# Patient Record
Sex: Female | Born: 1990 | Race: White | Hispanic: No | Marital: Married
Health system: Southern US, Community
[De-identification: ages and names within clinical notes are randomized; demographics above are authoritative.]

## PROBLEM LIST (undated history)

## (undated) DIAGNOSIS — E039 Hypothyroidism, unspecified: Secondary | ICD-10-CM

## (undated) DIAGNOSIS — E079 Disorder of thyroid, unspecified: Secondary | ICD-10-CM

## (undated) DIAGNOSIS — K219 Gastro-esophageal reflux disease without esophagitis: Secondary | ICD-10-CM

## (undated) HISTORY — DX: Disorder of thyroid, unspecified: E07.9

---
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
## 2011-09-11 ENCOUNTER — Ambulatory Visit (INDEPENDENT_AMBULATORY_CARE_PROVIDER_SITE_OTHER): Payer: BC Managed Care – PPO | Admitting: Physician Assistant

## 2011-09-11 VITALS — BP 113/74 | HR 71 | Temp 98.8°F | Resp 16 | Ht 69.0 in | Wt 170.0 lb

## 2011-09-11 DIAGNOSIS — J029 Acute pharyngitis, unspecified: Secondary | ICD-10-CM

## 2011-09-11 DIAGNOSIS — J310 Chronic rhinitis: Secondary | ICD-10-CM

## 2011-09-11 MED ORDER — IPRATROPIUM BROMIDE 0.03 % NA SOLN: 2.0000 | Freq: Two times a day (BID) | NASAL | Status: DC

## 2011-09-11 MED ORDER — GUAIFENESIN ER 1200 MG PO TB12: 1.0000 | ORAL_TABLET | Freq: Two times a day (BID) | ORAL | Status: DC | PRN

## 2011-09-11 NOTE — Patient Instructions (Signed)
Get as much rest as you can (make sure the Jawbone UP bracelet shows it!) and drink at least 64 ounces of fluids each day.

## 2011-09-11 NOTE — Progress Notes (Signed)
  Subjective:    Patient ID: Alyssa Bryan, female    DOB: December 29, 1990, 21 y.o.   MRN: [id number]  HPI The patient presents with intermittent sore throat for the last 4 weeks. Initially she had "a cold" and it then resolved though she continues to have throat pain off-and-on. It is worse on the right than the left. Some ear pain on the right as well. No significant nasal congestion. No cough, fever or chills. No myalgias, arthralgias or rash. No GU or GI symptoms. No headache or dizziness.  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 She also works at [employer] She had the flu in December.   Review of Systems As above.    Objective:   Physical Exam  Vitals reviewed. Constitutional: She is oriented to person, place, and time. Vital signs are normal. She appears well-developed and well-nourished. No distress.  HENT:  Head: Normocephalic and atraumatic.  Right Ear: Hearing, tympanic membrane, external ear and ear canal normal.  Left Ear: Hearing, tympanic membrane, external ear and ear canal normal.  Nose: Mucosal edema and rhinorrhea present.  No foreign bodies. Right sinus exhibits no maxillary sinus tenderness and no frontal sinus tenderness. Left sinus exhibits no maxillary sinus tenderness and no frontal sinus tenderness.  Mouth/Throat: Uvula is midline, oropharynx is clear and moist and mucous membranes are normal. No uvula swelling. No oropharyngeal exudate.  Eyes: Conjunctivae and EOM are normal. Pupils are equal, round, and reactive to light. Right eye exhibits no discharge. Left eye exhibits no discharge. No scleral icterus.  Neck: Trachea normal, normal range of motion and full passive range of motion without pain. Neck supple. No mass and no thyromegaly present.  Cardiovascular: Normal rate, regular rhythm and normal heart sounds.   Pulmonary/Chest: Effort normal and breath sounds normal.  Lymphadenopathy:       Head (right side): No submandibular, no tonsillar, no preauricular, no  posterior auricular and no occipital adenopathy present.       Head (left side): No submandibular, no tonsillar, no preauricular and no occipital adenopathy present.    She has no cervical adenopathy.       Right: No supraclavicular adenopathy present.       Left: No supraclavicular adenopathy present.  Neurological: She is alert and oriented to person, place, and time. She has normal strength. No cranial nerve deficit or sensory deficit.  Skin: Skin is warm, dry and intact. No rash noted.  Psychiatric: She has a normal mood and affect. Her speech is normal and behavior is normal.          Assessment & Plan:  Sore throat secondary to rhinitis and postnasal drainage.  Atrovent nasal spray 0.03%, 2 sprays in each nostril twice daily. Mucinex maximum strength twice a day. Rest, fluids.

## 2012-01-23 ENCOUNTER — Ambulatory Visit: Payer: BC Managed Care – PPO

## 2012-01-23 ENCOUNTER — Ambulatory Visit (INDEPENDENT_AMBULATORY_CARE_PROVIDER_SITE_OTHER): Payer: BC Managed Care – PPO | Admitting: Family Medicine

## 2012-01-23 VITALS — BP 102/58 | HR 63 | Temp 98.1°F | Resp 16 | Ht 70.0 in | Wt 176.0 lb

## 2012-01-23 DIAGNOSIS — M542 Cervicalgia: Secondary | ICD-10-CM

## 2012-01-23 DIAGNOSIS — R202 Paresthesia of skin: Secondary | ICD-10-CM

## 2012-01-23 DIAGNOSIS — R209 Unspecified disturbances of skin sensation: Secondary | ICD-10-CM

## 2012-01-23 DIAGNOSIS — E039 Hypothyroidism, unspecified: Secondary | ICD-10-CM

## 2012-01-23 LAB — POCT CBC
MCH, POC: 30.5 pg (ref 27–31.2)
MCHC: 31.9 g/dL (ref 31.8–35.4)
MCV: 95.8 fL (ref 80–97)
MID (cbc): 0.8 (ref 0–0.9)
MPV: 9.2 fL (ref 0–99.8)
POC LYMPH PERCENT: 26.1 %L (ref 10–50)
POC MID %: 6.5 %M (ref 0–12)
Platelet Count, POC: 326 10*3/uL (ref 142–424)
RBC: 4.78 M/uL (ref 4.04–5.48)
RDW, POC: 13.3 %
WBC: 11.7 10*3/uL — AB (ref 4.6–10.2)

## 2012-01-23 LAB — COMPREHENSIVE METABOLIC PANEL
ALT: 16 U/L (ref 0–35)
AST: 15 U/L (ref 0–37)
Alkaline Phosphatase: 81 U/L (ref 39–117)
BUN: 11 mg/dL (ref 6–23)
Chloride: 104 mEq/L (ref 96–112)
Creat: 0.71 mg/dL (ref 0.50–1.10)
Total Bilirubin: 0.6 mg/dL (ref 0.3–1.2)

## 2012-01-23 LAB — FOLATE: Folate: >20 ng/mL

## 2012-01-23 LAB — POCT SEDIMENTATION RATE: POCT SED RATE: 6 mm/hr (ref 0–22)

## 2012-01-23 NOTE — Progress Notes (Addendum)
Date:  01/23/2012   Name:  Alyssa Bryan   DOB:  30-Sep-1990   MRN:  [id number]  PCP:  No primary provider on file.    Chief Complaint: Tingling   History of Present Illness:  Alyssa Bryan is a 21 y.o. very pleasant female patient who presents with the following:  About one year ago she was seen with tingling in her left arm.  This problem went away, but then returned earlier this spring and has continued to get worse.  She now notes tingling/ pins and needles in her left upper arm and forearm, the right forearm, and both lower legs.  Her symptoms have been worsening for the last 2 or 3 months. She has more recently noted symptoms in her fingers as well.   No difference with certain activities or positions.   She sometimes feels weakness- she has difficulty picking up heavy objects in her left hand.  She does not note excessive fatigue in general, but sometimes certain body parts feel tired.  No proximal muscle weakness- able to stand from a chair, do a sit- up, etc.    She has not noted any problems or changes with her vision or hearing.   She is generally healthy.  She had a right inguinal hernia repair at 21 years old- no other surgeries.  No other major medical history. No family history of autoimmune disease or MS that she knows of.  She works at [employer], and is Clinical biochemist at [employer].   Not a smoker, no drugs.  She rarely drinks alcohol.  Here today with her mother.   There is no problem list on file for this patient.   No past medical history on file.  No past surgical history on file.  History  Substance Use Topics  . Smoking status: Never Smoker   . Smokeless tobacco: Not on file  . Alcohol Use: Not on file    No family history on file.  No Known Allergies  Medication list has been reviewed and updated.  Current Outpatient Prescriptions on File Prior to Visit  Medication Sig Dispense Refill  . desogestrel-ethinyl estradiol  (KARIVA,AZURETTE,MIRCETTE) 0.15-0.02/0.01 MG (21/5) tablet Take 1 tablet by mouth daily.      . Guaifenesin (MUCINEX MAXIMUM STRENGTH) 1200 MG TB12 Take 1 tablet (1,200 mg total) by mouth every 12 (twelve) hours as needed.  14 tablet  1  . ipratropium (ATROVENT) 0.03 % nasal spray Place 2 sprays into the nose 2 (two) times daily.  30 mL  5    Review of Systems:  As per HPI- otherwise negative.   Physical Examination: Filed Vitals:   01/23/12 1348  BP: 102/58  Pulse: 63  Temp: 98.1 F (36.7 C)  Resp: 16   Filed Vitals:   01/23/12 1348  Height: 5\' 10"  (1.778 m)  Weight: 176 lb (79.833 kg)   Body mass index is 25.25 kg/(m^2). Ideal Body Weight: Weight in (lb) to have BMI = 25: 173.9   GEN: WDWN, NAD, Non-toxic, A & O x 3, robust and healthy in appearance HEENT: Atraumatic, Normocephalic. Neck supple. No masses, No LAD.  Tm and oropharynx wnl.    PEERL, EOMI.  Ears and Nose: No external deformity. CV: RRR, No M/G/R. No JVD. No thrill. No extra heart sounds. PULM: CTA B, no wheezes, crackles, rhonchi. No retractions. No resp. distress. No accessory muscle use. ABD: S, NT, ND, +BS. No rebound. No HSM. EXTR: No c/c/e NEURO Normal gait.   Normal  strength and sensation all extremities, normal DTR all extremities, neg romberg.  Neuro exam virtually normal at this time.  PSYCH: Normally interactive. Conversant. Not depressed or anxious appearing.  Calm demeanor.   [facility] reading (PRIMARY) by  Dr. Patsy Lager.  Negative cervical spine series  CERVICAL SPINE - COMPLETE 4+ VIEW  Comparison: None  Findings: Normal alignment is noted. There is no evidence of fracture, subluxation or prevertebral soft tissue swelling. The disc spaces are maintained. There is no evidence of bony foraminal narrowing. No focal bony lesions are present.  IMPRESSION: Unremarkable cervical spine series.   Results for orders placed in visit on 01/23/12  POCT CBC      Component Value Range   WBC 11.7 (*)  4.6 - 10.2 K/uL   Lymph, poc 3.1  0.6 - 3.4   POC LYMPH PERCENT 26.1  10 - 50 %L   MID (cbc) 0.8  0 - 0.9   POC MID % 6.5  0 - 12 %M   POC Granulocyte 7.9 (*) 2 - 6.9   Granulocyte percent 67.4  37 - 80 %G   RBC 4.78  4.04 - 5.48 M/uL   Hemoglobin 14.6  12.2 - 16.2 g/dL   HCT, POC 21.3  08.6 - 47.9 %   MCV 95.8  80 - 97 fL   MCH, POC 30.5  27 - 31.2 pg   MCHC 31.9  31.8 - 35.4 g/dL   RDW, POC 57.8     Platelet Count, POC 326  142 - 424 K/uL   MPV 9.2  0 - 99.8 fL  POCT SEDIMENTATION RATE      Component Value Range   POCT SED RATE 6  0 - 22 mm/hr   Assessment and Plan: 1. Tingling in extremities  POCT CBC, Comprehensive metabolic panel, TSH, Vitamin B12, Folate, DG Cervical Spine Complete, POCT SEDIMENTATION RATE   Await labs as above.  However, her symptoms are concerning for MS.  Percy has done her own research about her symptoms- she was not surprised when I mentioned MS.  Assuming no major lab abnormalities will plan to contact neurology- she may need an MRI brain to evaluate for MS.    Merlene Dante, MD  addnd 01/24/12  Results for orders placed in visit on 01/23/12  POCT CBC      Component Value Range   WBC 11.7 (*) 4.6 - 10.2 K/uL   Lymph, poc 3.1  0.6 - 3.4   POC LYMPH PERCENT 26.1  10 - 50 %L   MID (cbc) 0.8  0 - 0.9   POC MID % 6.5  0 - 12 %M   POC Granulocyte 7.9 (*) 2 - 6.9   Granulocyte percent 67.4  37 - 80 %G   RBC 4.78  4.04 - 5.48 M/uL   Hemoglobin 14.6  12.2 - 16.2 g/dL   HCT, POC 46.9  62.9 - 47.9 %   MCV 95.8  80 - 97 fL   MCH, POC 30.5  27 - 31.2 pg   MCHC 31.9  31.8 - 35.4 g/dL   RDW, POC 52.8     Platelet Count, POC 326  142 - 424 K/uL   MPV 9.2  0 - 99.8 fL  COMPREHENSIVE METABOLIC PANEL      Component Value Range   Sodium 139  135 - 145 mEq/L   Potassium 3.9  3.5 - 5.3 mEq/L   Chloride 104  96 - 112 mEq/L   CO2 26  19 - 32  mEq/L   Glucose, Bld 82  70 - 99 mg/dL   BUN 11  6 - 23 mg/dL   Creat 4.78  2.95 - 6.21 mg/dL   Total Bilirubin  0.6  0.3 - 1.2 mg/dL   Alkaline Phosphatase 81  39 - 117 U/L   AST 15  0 - 37 U/L   ALT 16  0 - 35 U/L   Total Protein 7.2  6.0 - 8.3 g/dL   Albumin 4.7  3.5 - 5.2 g/dL   Calcium 9.7  8.4 - 30.8 mg/dL  TSH      Component Value Range   TSH 6.139 (*) 0.350 - 4.500 uIU/mL  VITAMIN B12      Component Value Range   Vitamin B-12 402  211 - 911 pg/mL  FOLATE      Component Value Range   Folate >20.0    POCT SEDIMENTATION RATE      Component Value Range   POCT SED RATE 6  0 - 22 mm/hr   Called and discussed with her- she is hypothyroid.  This is likely the cause of her peripheral neuropathy symptoms.  Will start treatment with synthroid at 25 mcg.  She will RTC in 3 weeks for a recheck, and a repeat TSH and CBC.  She is happy to hear this news- we anticipate that her symptoms will resolve with thyroid replacement.

## 2012-01-24 ENCOUNTER — Encounter: Payer: Self-pay | Admitting: Family Medicine

## 2012-01-24 MED ORDER — LEVOTHYROXINE SODIUM 25 MCG PO TABS: 25.0000 ug | ORAL_TABLET | Freq: Every day | ORAL | Status: DC

## 2012-01-24 NOTE — Addendum Note (Signed)
Addended by: Abbe Amsterdam C on: 01/24/2012 04:02 PM   Modules accepted: Orders

## 2012-03-02 ENCOUNTER — Ambulatory Visit (INDEPENDENT_AMBULATORY_CARE_PROVIDER_SITE_OTHER): Payer: BC Managed Care – PPO | Admitting: Family Medicine

## 2012-03-02 VITALS — BP 118/76 | HR 74 | Temp 98.3°F | Resp 18 | Ht 69.0 in | Wt 184.4 lb

## 2012-03-02 DIAGNOSIS — R209 Unspecified disturbances of skin sensation: Secondary | ICD-10-CM

## 2012-03-02 DIAGNOSIS — E039 Hypothyroidism, unspecified: Secondary | ICD-10-CM

## 2012-03-02 DIAGNOSIS — R200 Anesthesia of skin: Secondary | ICD-10-CM

## 2012-03-02 LAB — POCT CBC
Lymph, poc: 2.5 (ref 0.6–3.4)
MCH, POC: 30.4 pg (ref 27–31.2)
MCHC: 32 g/dL (ref 31.8–35.4)
MCV: 95 fL (ref 80–97)
MID (cbc): 0.7 (ref 0–0.9)
POC LYMPH PERCENT: 29 %L (ref 10–50)
Platelet Count, POC: 250 10*3/uL (ref 142–424)
RDW, POC: 12.4 %
WBC: 8.6 10*3/uL (ref 4.6–10.2)

## 2012-03-02 NOTE — Progress Notes (Addendum)
[facility] [address], [place] [address] [phone]  Date:  03/02/2012   Name:  Alyssa Bryan   DOB:  Dec 06, 1990   MRN:  [id number]  PCP:  Tally Due, MD    Chief Complaint: Hypothyroidism   History of Present Illness:  Alyssa Bryan is a 21 y.o. very pleasant female patient who presents with the following:  Here today for a recheck.  She was here in July and was diagnosed with hyporthyroidism.  At that time she also had complaint of tingling in her arms and legs.  Her TSH was 6.139, and she was started on synthroid 25 mcg.  The tingling has resolved from her legs- she sometimes still feels it in her toes.  Still has intermittent tingling in her right forearm.  No more weakness.  Overall she feels better, but not 100%.  She had noted significant imporvement when she first started the synthroid- however the improvement seemed to stop and reach a new plateau.  We are going to recheck her TSH today.  She also had a mild leukocytosis at that time which we will recheck today.    There is no problem list on file for this patient.   No past medical history on file.  No past surgical history on file.  History  Substance Use Topics  . Smoking status: Never Smoker   . Smokeless tobacco: Not on file  . Alcohol Use: Not on file    No family history on file.  No Known Allergies  Medication list has been reviewed and updated.  Current Outpatient Prescriptions on File Prior to Visit  Medication Sig Dispense Refill  . levothyroxine (LEVOTHROID) 25 MCG tablet Take 1 tablet (25 mcg total) by mouth daily.  30 tablet  3    Review of Systems:  As per HPI- otherwise negative.   Physical Examination: Filed Vitals:   03/02/12 0756  BP: 118/76  Pulse: 74  Temp: 98.3 F (36.8 C)  Resp: 18   Filed Vitals:   03/02/12 0756  Height: 5\' 9"  (1.753 m)  Weight: 184 lb 6.4 oz (83.643 kg)   Body mass index is 27.23 kg/(m^2). Ideal Body  Weight: Weight in (lb) to have BMI = 25: 168.9   GEN: WDWN, NAD, Non-toxic, A & O x 3, slightly overweight HEENT: Atraumatic, Normocephalic. Neck supple. No masses, No LAD.  PEERL, EOMI, oropharynx wnl.   Ears and Nose: No external deformity. CV: RRR, No M/G/R. No JVD. No thrill. No extra heart sounds. PULM: CTA B, no wheezes, crackles, rhonchi. No retractions. No resp. distress. No accessory muscle use. EXTR: No c/c/e NEURO Normal gait. Full strength and sensation all extremities, normal and symmetrical DTR.   PSYCH: Normally interactive. Conversant. Not depressed or anxious appearing.  Calm demeanor.   Results for orders placed in visit on 03/02/12  POCT CBC      Component Value Range   WBC 8.6  4.6 - 10.2 K/uL   Lymph, poc 2.5  0.6 - 3.4   POC LYMPH PERCENT 29.0  10 - 50 %L   MID (cbc) 0.7  0 - 0.9   POC MID % 7.7  0 - 12 %M   POC Granulocyte 5.4  2 - 6.9   Granulocyte percent 63.3  37 - 80 %G   RBC 4.18  4.04 - 5.48 M/uL   Hemoglobin 12.7  12.2 - 16.2 g/dL   HCT, POC 65.7  84.6 - 47.9 %  MCV 95.0  80 - 97 fL   MCH, POC 30.4  27 - 31.2 pg   MCHC 32.0  31.8 - 35.4 g/dL   RDW, POC 78.4     Platelet Count, POC 250  142 - 424 K/uL   MPV 9.4  0 - 99.8 fL    Assessment and Plan: 1. Hypothyroidism  POCT CBC, TSH  2. Numbness     Leukocytosis has resolved.  Await repeat TSH.  May need to adjust her thyroid replacement today.  If she is therapeutic already will proceed with MRI brain- however anticipate that we will need to increase her dose of medication today, as she is on a small dose.    Bich Mchaney, MD  Called her- her TSH is now 8.8.  Need to increase her synthroid- will call in 50 mcg.  Can double up on her current dose in the meantime.  Come in for a recheck TSH (lab visit only ok) in about 3 weeks, call if any problems in the meantime.

## 2012-03-03 MED ORDER — LEVOTHYROXINE SODIUM 50 MCG PO TABS: 50.0000 ug | ORAL_TABLET | Freq: Every day | ORAL | Status: DC

## 2012-03-03 NOTE — Addendum Note (Signed)
Addended by: Abbe Amsterdam C on: 03/03/2012 04:21 PM   Modules accepted: Orders

## 2012-04-08 ENCOUNTER — Other Ambulatory Visit (INDEPENDENT_AMBULATORY_CARE_PROVIDER_SITE_OTHER): Payer: BC Managed Care – PPO | Admitting: Family Medicine

## 2012-04-08 VITALS — BP 121/69 | HR 70 | Temp 98.5°F | Resp 16 | Ht 68.78 in | Wt 184.0 lb

## 2012-04-08 DIAGNOSIS — E039 Hypothyroidism, unspecified: Secondary | ICD-10-CM

## 2012-04-09 ENCOUNTER — Encounter: Payer: Self-pay | Admitting: Family Medicine

## 2012-04-22 ENCOUNTER — Ambulatory Visit (INDEPENDENT_AMBULATORY_CARE_PROVIDER_SITE_OTHER): Payer: BC Managed Care – PPO | Admitting: Internal Medicine

## 2012-04-22 VITALS — BP 117/76 | HR 78 | Temp 98.2°F | Resp 16 | Ht 70.0 in | Wt 184.6 lb

## 2012-04-22 DIAGNOSIS — J029 Acute pharyngitis, unspecified: Secondary | ICD-10-CM

## 2012-04-22 DIAGNOSIS — J020 Streptococcal pharyngitis: Secondary | ICD-10-CM

## 2012-04-22 DIAGNOSIS — E079 Disorder of thyroid, unspecified: Secondary | ICD-10-CM

## 2012-04-22 MED ORDER — AZITHROMYCIN 500 MG PO TABS: 500.0000 mg | ORAL_TABLET | Freq: Every day | ORAL | Status: DC

## 2012-04-22 NOTE — Progress Notes (Signed)
  Subjective:    Patient ID: Alyssa Bryan, female    DOB: [birth year], 21 y.o.   MRN: [id number]  HPI sore throat moderate in severity  onset 2 days ago  no fever  Difficulty swallowing Pain 5/10 increased on the right side. Myalgias ins crease with swallowing.  Review of Systems  Constitutional: Positive for activity change and fatigue.  HENT: Positive for sore throat.   All other systems reviewed and are negative.       Objective:   Physical Exam  Nursing note and vitals reviewed. Constitutional: She is oriented to person, place, and time. She appears well-developed and well-nourished. No distress.  HENT:  Mouth/Throat: Oropharyngeal exudate present.  Eyes: Conjunctivae normal and EOM are normal. Pupils are equal, round, and reactive to light.  Neck: Normal range of motion. Neck supple.  Cardiovascular: Normal rate, regular rhythm and normal heart sounds.   Pulmonary/Chest: Effort normal and breath sounds normal.  Abdominal: Soft.  Musculoskeletal: Normal range of motion.  Lymphadenopathy:    She has cervical adenopathy.  Neurological: She is alert and oriented to person, place, and time. She has normal reflexes.  Skin: Skin is warm and dry. She is not diaphoretic.  Psychiatric: She has a normal mood and affect. Her behavior is normal. Judgment and thought content normal.   Results for orders placed in visit on 04/22/12  POCT RAPID STREP A (OFFICE)      Component Value Range   Rapid Strep A Screen Positive (*) Negative    Results for orders placed in visit on 04/08/12  TSH      Component Value Range   TSH 4.180  0.350 - 4.500 uIU/mL     Results for orders placed in visit on 04/08/12  TSH      Component Value Range   TSH 4.180  0.350 - 4.500 uIU/mL      Assessment & Plan:  Strep screen  zithromax 500 daily for 5 days. ibuprogfen 600 mg every 8 hours as needed for pain Gargle with salt water

## 2012-04-22 NOTE — Patient Instructions (Addendum)
Take antibiotics as directed. Ibuprofen for pain gargle with warm salt water.

## 2012-05-29 ENCOUNTER — Ambulatory Visit (INDEPENDENT_AMBULATORY_CARE_PROVIDER_SITE_OTHER): Payer: BC Managed Care – PPO | Admitting: Emergency Medicine

## 2012-05-29 ENCOUNTER — Telehealth: Payer: Self-pay

## 2012-05-29 VITALS — BP 120/82 | HR 73 | Temp 98.0°F | Resp 17 | Ht 69.0 in | Wt 188.0 lb

## 2012-05-29 DIAGNOSIS — R202 Paresthesia of skin: Secondary | ICD-10-CM

## 2012-05-29 DIAGNOSIS — E039 Hypothyroidism, unspecified: Secondary | ICD-10-CM

## 2012-05-29 NOTE — Progress Notes (Signed)
[facility] [address], [place] [address] [phone]  Date:  05/29/2012   Name:  Alyssa Bryan   DOB:  1991-03-17   MRN:  [id number]  PCP:  Tally Due, MD    Chief Complaint: Dizziness and Near Syncope   History of Present Illness:  Alyssa Bryan is a 21 y.o. very pleasant female patient who presents with the following:  History of hypothyroidism treated with 50 micrograms thyroid. On stable dose.  Initially had marked paresthesias and weakness predominantly involving the left arm.  Now her symptoms have worsened and are involving the right arm more than the left and were to a degree that she could not hold a pencil while taking an exam for about five minutes.  Denies any central symptoms.  No headache, other neuro or visual symptoms.  . She does not note excessive fatigue in general, but sometimes certain body parts feel tired. No proximal muscle weakness- able to stand from a chair, do a sit- up, etc   Patient Active Problem List  Diagnosis  . Thyroid dysfunction    Past Medical History  Diagnosis Date  . Thyroid disease     History reviewed. No pertinent past surgical history.  History  Substance Use Topics  . Smoking status: Never Smoker   . Smokeless tobacco: Not on file  . Alcohol Use: 0.6 oz/week    1 Glasses of wine per week    History reviewed. No pertinent family history.  No Known Allergies  Medication list has been reviewed and updated.  Current Outpatient Prescriptions on File Prior to Visit  Medication Sig Dispense Refill  . azithromycin (ZITHROMAX) 500 MG tablet Take 1 tablet (500 mg total) by mouth daily.  5 tablet  0  . levothyroxine (SYNTHROID, LEVOTHROID) 50 MCG tablet Take 1 tablet (50 mcg total) by mouth daily.  30 tablet  3    Review of Systems:  As per HPI, otherwise negative.    Physical Examination: Filed Vitals:   05/29/12 1731  BP: 120/82  Pulse: 73  Temp: 98 F (36.7 C)  Resp: 17    Filed Vitals:   05/29/12 1731  Height: 5\' 9"  (1.753 m)  Weight: 188 lb (85.276 kg)   Body mass index is 27.76 kg/(m^2). Ideal Body Weight: Weight in (lb) to have BMI = 25: 168.9   GEN: WDWN, NAD, Non-toxic, A & O x 3 HEENT: Atraumatic, Normocephalic. Neck supple. No masses, No LAD. Ears and Nose: No external deformity. CV: RRR, No M/G/R. No JVD. No thrill. No extra heart sounds. PULM: CTA B, no wheezes, crackles, rhonchi. No retractions. No resp. distress. No accessory muscle use. ABD: S, NT, ND, +BS. No rebound. No HSM. EXTR: No c/c/e NEURO Normal gait, balance and coordination.  PSYCH: Normally interactive. Conversant. Not depressed or anxious appearing.  Calm demeanor.    Assessment and Plan: Hypothyroidism Will check TSH Suggested to patient that she obtain Synthroid rather than generic in future.  Carmelina Dane, MD

## 2012-05-29 NOTE — Telephone Encounter (Signed)
Pt called asking that Dr Patsy Lager give her a call back she would not go into detail as to what the message is about  Best number [phone]

## 2012-05-30 NOTE — Telephone Encounter (Signed)
Called patient to get details and she stated you wanted her to let you know if she started having any sxs. She was but came in last night to be seen and was seen by Dr. Dareen Piano. He did some labs and told her he would make sure you saw them as well.

## 2012-05-31 ENCOUNTER — Telehealth: Payer: Self-pay | Admitting: Family Medicine

## 2012-05-31 DIAGNOSIS — R531 Weakness: Secondary | ICD-10-CM

## 2012-05-31 NOTE — Telephone Encounter (Signed)
Called and spoke with her.  Her TSH is actually still ok.  She has not had any change in the brand or appearance of her thyroid replacement recently.   I am going to refer her to neurology to be certain there is nothing else going on.  We had discussed MS before she was found to be hypothyroid- will have her see neurology to rule this out.  She is ok with this plan, will let us know if anything changes or gets worse in the meantime

## 2012-06-14 NOTE — Progress Notes (Signed)
Reviewed and agree.

## 2012-07-04 ENCOUNTER — Telehealth: Payer: Self-pay

## 2012-07-04 DIAGNOSIS — E039 Hypothyroidism, unspecified: Secondary | ICD-10-CM

## 2012-07-04 NOTE — Telephone Encounter (Signed)
PT IS CALLING REQUESTING A REFILL ON SYNTHROID   PHARMACY: [facility] [address] [phone]

## 2012-07-05 ENCOUNTER — Other Ambulatory Visit: Payer: Self-pay | Admitting: Family Medicine

## 2012-07-05 MED ORDER — LEVOTHYROXINE SODIUM 50 MCG PO TABS: 50.0000 ug | ORAL_TABLET | Freq: Every day | ORAL | Status: DC

## 2012-07-05 NOTE — Telephone Encounter (Signed)
Rx sent to pharmacy   

## 2012-07-05 NOTE — Telephone Encounter (Signed)
Patient advised.

## 2012-08-03 ENCOUNTER — Ambulatory Visit (INDEPENDENT_AMBULATORY_CARE_PROVIDER_SITE_OTHER): Payer: PRIVATE HEALTH INSURANCE | Admitting: Family Medicine

## 2012-08-03 ENCOUNTER — Encounter (HOSPITAL_COMMUNITY): Payer: Self-pay

## 2012-08-03 ENCOUNTER — Inpatient Hospital Stay (HOSPITAL_COMMUNITY)
Admission: AD | Admit: 2012-08-03 | Discharge: 2012-08-04 | Disposition: A | Discharge status: Home / Self Care | Payer: PRIVATE HEALTH INSURANCE | Source: Ambulatory Visit | Attending: Obstetrics & Gynecology | Admitting: Obstetrics & Gynecology

## 2012-08-03 ENCOUNTER — Inpatient Hospital Stay (HOSPITAL_COMMUNITY): Payer: PRIVATE HEALTH INSURANCE

## 2012-08-03 VITALS — BP 127/80 | HR 80 | Temp 98.6°F | Resp 12 | Ht 69.0 in | Wt 197.0 lb

## 2012-08-03 DIAGNOSIS — D72829 Elevated white blood cell count, unspecified: Secondary | ICD-10-CM

## 2012-08-03 DIAGNOSIS — O99891 Other specified diseases and conditions complicating pregnancy: Secondary | ICD-10-CM | POA: Insufficient documentation

## 2012-08-03 DIAGNOSIS — Z3490 Encounter for supervision of normal pregnancy, unspecified, unspecified trimester: Secondary | ICD-10-CM

## 2012-08-03 DIAGNOSIS — O26899 Other specified pregnancy related conditions, unspecified trimester: Secondary | ICD-10-CM

## 2012-08-03 DIAGNOSIS — R109 Unspecified abdominal pain: Secondary | ICD-10-CM | POA: Insufficient documentation

## 2012-08-03 DIAGNOSIS — Z331 Pregnant state, incidental: Secondary | ICD-10-CM

## 2012-08-03 LAB — CBC WITH DIFFERENTIAL/PLATELET
Basophils Absolute: 0 10*3/uL (ref 0.0–0.1)
Basophils Relative: 0 % (ref 0–1)
Eosinophils Absolute: 0.1 10*3/uL (ref 0.0–0.7)
Eosinophils Relative: 1 % (ref 0–5)
HCT: 37.4 % (ref 36.0–46.0)
MCH: 30.9 pg (ref 26.0–34.0)
MCHC: 34.2 g/dL (ref 30.0–36.0)
Monocytes Absolute: 0.9 10*3/uL (ref 0.1–1.0)
Monocytes Relative: 8 % (ref 3–12)
Neutro Abs: 8.7 10*3/uL — ABNORMAL HIGH (ref 1.7–7.7)
RDW: 12.7 % (ref 11.5–15.5)

## 2012-08-03 LAB — POCT CBC
Granulocyte percent: 65.5 %G (ref 37–80)
MID (cbc): 0.8 (ref 0–0.9)
MPV: 9.2 fL (ref 0–99.8)
POC MID %: 6.9 %M (ref 0–12)
Platelet Count, POC: 350 10*3/uL (ref 142–424)
RBC: 4.56 M/uL (ref 4.04–5.48)

## 2012-08-03 LAB — POCT URINALYSIS DIPSTICK
Glucose, UA: NEGATIVE
Ketones, UA: NEGATIVE
Spec Grav, UA: 1.02

## 2012-08-03 LAB — POCT UA - MICROSCOPIC ONLY

## 2012-08-03 LAB — POCT URINE PREGNANCY: Preg Test, Ur: POSITIVE

## 2012-08-03 NOTE — MAU Provider Note (Signed)
History     CSN: [id number]  Arrival date and time: 08/03/12 2116   None     Chief Complaint  Patient presents with  . Abdominal Pain   HPI Alyssa Bryan is a 22 y.o. female @ approximately [redacted] weeks gestation who presents to MAU with abdominal pain. The pain started a few days ago. She describes the pain as sharp that is constant. She rates the pain as 5/10. Earlier the pain was 7/10. Associated symptoms include low back pain. She was evaluated at Urgent Care and sent to MAU for ultrasound and further evaluation. The history was provided by the patient and her medical record.  OB History    Grav Para Term Preterm Abortions TAB SAB Ect Mult Living   1               Past Medical History  Diagnosis Date  . Thyroid disease   . No pertinent past medical history     Past Surgical History  Procedure Date  . Hernia repair     History reviewed. No pertinent family history.  History  Substance Use Topics  . Smoking status: Never Smoker   . Smokeless tobacco: Not on file  . Alcohol Use: 0.6 oz/week    1 Glasses of wine per week    Allergies: No Known Allergies  Prescriptions prior to admission  Medication Sig Dispense Refill  . levothyroxine (SYNTHROID, LEVOTHROID) 50 MCG tablet Take 1 tablet (50 mcg total) by mouth daily.  30 tablet  3  . [DISCONTINUED] azithromycin (ZITHROMAX) 500 MG tablet Take 1 tablet (500 mg total) by mouth daily.  5 tablet  0    Review of Systems  Constitutional: Negative for fever, chills and weight loss.  HENT: Negative for ear pain, nosebleeds, congestion, sore throat and neck pain.   Eyes: Negative for blurred vision, double vision, photophobia and pain.  Respiratory: Positive for cough. Negative for shortness of breath and wheezing.   Cardiovascular: Negative for chest pain, palpitations and leg swelling.  Gastrointestinal: Positive for nausea and abdominal pain. Negative for heartburn, vomiting, diarrhea and constipation.  Genitourinary:  Positive for frequency. Negative for dysuria and urgency.  Musculoskeletal: Positive for back pain. Negative for myalgias.  Skin: Negative for itching and rash.  Neurological: Negative for dizziness, sensory change, speech change, seizures, weakness and headaches.  Endo/Heme/Allergies: Does not bruise/bleed easily.  Psychiatric/Behavioral: Negative for depression and substance abuse. The patient is not nervous/anxious and does not have insomnia.    Physical Exam   Blood pressure 117/84, pulse 90, temperature 98.2 F (36.8 C), resp. rate 20, height 5\' 9"  (1.753 m), weight 199 lb 2 oz (90.323 kg), last menstrual period 07/04/2012, SpO2 100.00%.  Physical Exam  Nursing note and vitals reviewed. Constitutional: She is oriented to person, place, and time. She appears well-developed and well-nourished. No distress.  HENT:  Head: Normocephalic and atraumatic.  Eyes: EOM are normal.  Neck: Neck supple.  Cardiovascular: Normal rate.   Respiratory: Effort normal.  GI: Soft. There is tenderness in the right lower quadrant. There is no rigidity, no rebound and no guarding.       Tenderness is mild.  Genitourinary:       External genitalia without lesions. Mucous discharge vaginal vault. Cervix long, closed, no CMT, minimal tenderness right adnexa. Uterus without palpable enlargement.  Musculoskeletal: Normal range of motion.  Neurological: She is alert and oriented to person, place, and time.  Skin: Skin is warm and dry.  Psychiatric: She has  a normal mood and affect. Her behavior is normal. Judgment and thought content normal.   Results for orders placed during the hospital encounter of 08/03/12 (from the past 24 hour(s))  CBC WITH DIFFERENTIAL     Status: Abnormal   Collection Time   08/03/12 10:06 PM      Component Value Range   WBC 11.7 (*) 4.0 - 10.5 K/uL   RBC 4.14  3.87 - 5.11 MIL/uL   Hemoglobin 12.8  12.0 - 15.0 g/dL   HCT 54.0  98.1 - 19.1 %   MCV 90.3  78.0 - 100.0 fL   MCH 30.9   26.0 - 34.0 pg   MCHC 34.2  30.0 - 36.0 g/dL   RDW 47.8  29.5 - 62.1 %   Platelets 278  150 - 400 K/uL   Neutrophils Relative 74  43 - 77 %   Neutro Abs 8.7 (*) 1.7 - 7.7 K/uL   Lymphocytes Relative 18  12 - 46 %   Lymphs Abs 2.1  0.7 - 4.0 K/uL   Monocytes Relative 8  3 - 12 %   Monocytes Absolute 0.9  0.1 - 1.0 K/uL   Eosinophils Relative 1  0 - 5 %   Eosinophils Absolute 0.1  0.0 - 0.7 K/uL   Basophils Relative 0  0 - 1 %   Basophils Absolute 0.0  0.0 - 0.1 K/uL  HCG, QUANTITATIVE, PREGNANCY     Status: Abnormal   Collection Time   08/03/12 10:06 PM      Component Value Range   hCG, Beta Chain, Quant, S 4306 (*) <5 mIU/mL  ABO/RH     Status: Normal (Preliminary result)   Collection Time   08/03/12 10:06 PM      Component Value Range   ABO/RH(D) A POS    WET PREP, GENITAL     Status: Abnormal   Collection Time   08/03/12 11:59 PM      Component Value Range   Yeast Wet Prep HPF POC NONE SEEN  NONE SEEN   Trich, Wet Prep NONE SEEN  NONE SEEN   Clue Cells Wet Prep HPF POC NONE SEEN  NONE SEEN   WBC, Wet Prep HPF POC FEW (*) NONE SEEN    Procedures US Ob Comp Less 14 Wks  08/03/2012  *RADIOLOGY REPORT*  Clinical Data: 22 year old pregnant female with right pelvic pain. Estimated gestational age of [redacted] weeks 1 day by LMP.  OBSTETRIC <14 WK [place] AND TRANSVAGINAL OB US  Technique:  Both transabdominal and transvaginal ultrasound examinations were performed for complete evaluation of the gestation as well as the maternal uterus, adnexal regions, and pelvic cul-de-sac.  Transvaginal technique was performed to assess early pregnancy.  Comparison:  None  Intrauterine gestational sac:  Visualized/normal in shape. Yolk sac: Visualized Embryo: Not visualized  MSD: 7.2 mm  5 w 2 d    [place] EDC: 04/03/2013  Maternal uterus/adnexae: There is no evidence of subchorionic hemorrhage. The ovaries bilaterally are unremarkable. There is no evidence of free fluid or adnexal mass.  IMPRESSION: Single intrauterine  gestational sac with yolk sac - estimated gestational age of [redacted] weeks 2 days by this ultrasound.  Fetal pole not visualized at this time.  No evidence of subchorionic hemorrhage.   Original Report Authenticated By: Harmon Pier, M.D.    US Ob Transvaginal  08/03/2012  *RADIOLOGY REPORT*  Clinical Data: 22 year old pregnant female with right pelvic pain. Estimated gestational age of [redacted] weeks 1 day by LMP.  OBSTETRIC <14 WK [place] AND TRANSVAGINAL OB US  Technique:  Both transabdominal and transvaginal ultrasound examinations were performed for complete evaluation of the gestation as well as the maternal uterus, adnexal regions, and pelvic cul-de-sac.  Transvaginal technique was performed to assess early pregnancy.  Comparison:  None  Intrauterine gestational sac:  Visualized/normal in shape. Yolk sac: Visualized Embryo: Not visualized  MSD: 7.2 mm  5 w 2 d    [place] EDC: 04/03/2013  Maternal uterus/adnexae: There is no evidence of subchorionic hemorrhage. The ovaries bilaterally are unremarkable. There is no evidence of free fluid or adnexal mass.  IMPRESSION: Single intrauterine gestational sac with yolk sac - estimated gestational age of [redacted] weeks 2 days by this ultrasound.  Fetal pole not visualized at this time.  No evidence of subchorionic hemorrhage.   Original Report Authenticated By: Harmon Pier, M.D.     Assessment: 22 y.o. female @ 5.[redacted] weeks gestation with abdominal pain  Diff Dx: Discomforts of pregnancy, ovarian cyst, ectopic pregnancy   Appendicitis   Ultrasound shows no ectopic pregnancy, no ovarian cyst. Since the pain started a few days ago, she has no fever, no vomiting and WBC is only slightly elevated doubt appendicitis.  Plan:  Discussed with the patient in detail abdominal pain in pregnancy and remote possibility    of appendicitis.    Return tomorrow for recheck and repeat CBC if her pain increases, she develops fever,    persistent vomiting or other problems she will return here or go to [facility] ED    immediately.  Patient voices understanding  I have reviewed this patient's vital signs, nurses notes, appropriate labs and imaging.    NEESE,HOPE, RN, FNP, [facility] 08/04/2012, 12:25 AM   There is an IUGS with yolk sac.  No evidence of ectopic. Attestation of Attending Supervision of Advanced Practitioner (CNM/NP): Evaluation and management procedures were performed by the Advanced Practitioner under my supervision and collaboration. I have reviewed the Advanced Practitioner's note and chart, and I agree with the management and plan.  Shahd Occhipinti H. 4:48 AM

## 2012-08-03 NOTE — MAU Note (Signed)
Pt sent from [facility] for an [place] to R/O ectopic

## 2012-08-03 NOTE — Patient Instructions (Addendum)
Get the ultrasound of the pelvis and low abdomen.  Repeat white count tomorrow morning if any symptoms.  Go to [facility] hospital

## 2012-08-03 NOTE — Progress Notes (Signed)
  Subjective: Patient had a period 4-5 weeks ago. About a week later she had a condom break and took the morning after pill. She has started having some low abdominal and right lower quadrant pain the last 3 days which was worse today. No fever. Some nausea. No vomiting. Normal bowel habits. She thought it was her menses coming on, but was worried it might be something else.  Objective: Afebrile. Normal bowel sounds. Abdomen soft but tender from the umbilicus to the pubis, more in the lower abdomen, more to the right. No rebound.  Results for orders placed in visit on 08/03/12  POCT CBC      Component Value Range   WBC 12.3 (*) 4.6 - 10.2 K/uL   Lymph, poc 3.4  0.6 - 3.4   POC LYMPH PERCENT 27.6  10 - 50 %L   MID (cbc) 0.8  0 - 0.9   POC MID % 6.9  0 - 12 %M   POC Granulocyte 8.1 (*) 2 - 6.9   Granulocyte percent 65.5  37 - 80 %G   RBC 4.56  4.04 - 5.48 M/uL   Hemoglobin 13.9  12.2 - 16.2 g/dL   HCT, POC 16.1  09.6 - 47.9 %   MCV 95.1  80 - 97 fL   MCH, POC 30.5  27 - 31.2 pg   MCHC 32.0  31.8 - 35.4 g/dL   RDW, POC 04.5     Platelet Count, POC 350  142 - 424 K/uL   MPV 9.2  0 - 99.8 fL  POCT UA - MICROSCOPIC ONLY      Component Value Range   WBC, Ur, HPF, POC 0-2     RBC, urine, microscopic 0-2     Bacteria, U Microscopic 3+     Mucus, UA neg     Epithelial cells, urine per micros 0-4     Crystals, Ur, HPF, POC neg     Casts, Ur, LPF, POC neg     Yeast, UA neg    POCT URINALYSIS DIPSTICK      Component Value Range   Color, UA yellow     Clarity, UA clear     Glucose, UA neg     Bilirubin, UA neg     Ketones, UA neg     Spec Grav, UA 1.020     Blood, UA trace-intact     pH, UA 6.0     Protein, UA neg     Urobilinogen, UA 0.2     Nitrite, UA neg     Leukocytes, UA Trace    POCT URINE PREGNANCY      Component Value Range   Preg Test, Ur Positive      With a mild leukocytosis and the abdominal pain in the early pregnancy I do not know what is going on yet. Will  order a ultrasound on worst at night. Need to rule out ectopic.  We're sending her to [facility] for the evaluation an ultrasound. If she does not have an ectopic, with the diagnosis of appendicitis is move his, but could wait until tomorrow morning probably since the white count is only 12,000.  Pelvic exam not yet done, but she is to go to [facility] emergency room if they can do that there.

## 2012-08-04 LAB — WET PREP, GENITAL: Yeast Wet Prep HPF POC: NONE SEEN

## 2012-08-04 LAB — GC/CHLAMYDIA PROBE AMP
CT Probe RNA: NEGATIVE
GC Probe RNA: NEGATIVE

## 2012-10-25 ENCOUNTER — Ambulatory Visit (INDEPENDENT_AMBULATORY_CARE_PROVIDER_SITE_OTHER): Payer: PRIVATE HEALTH INSURANCE | Admitting: Emergency Medicine

## 2012-10-25 VITALS — BP 131/80 | HR 89 | Temp 97.5°F | Resp 16 | Ht 68.58 in | Wt 199.4 lb

## 2012-10-25 DIAGNOSIS — E039 Hypothyroidism, unspecified: Secondary | ICD-10-CM

## 2012-10-25 LAB — TSH: TSH: 3.931 u[IU]/mL (ref 0.350–4.500)

## 2012-10-25 NOTE — Progress Notes (Signed)
[facility] [address], [place] [address] [phone]  Date:  10/25/2012   Name:  Alyssa Bryan   DOB:  1990-10-03   MRN:  [id number]  PCP:  Tally Due, MD    Chief Complaint: Hypothyroidism   History of Present Illness:  Alyssa Bryan is a 22 y.o. very pleasant female patient who presents with the following:  History of hypothyroidism and is due for a refill on her medication.  Needs a recheck on her labs.  Denies other complaint or health concern today.   Patient Active Problem List  Diagnosis  . Thyroid dysfunction    Past Medical History  Diagnosis Date  . Thyroid disease   . No pertinent past medical history     Past Surgical History  Procedure Laterality Date  . Hernia repair      History  Substance Use Topics  . Smoking status: Never Smoker   . Smokeless tobacco: Not on file  . Alcohol Use: 0.6 oz/week    1 Glasses of wine per week    No family history on file.  No Known Allergies  Medication list has been reviewed and updated.  Current Outpatient Prescriptions on File Prior to Visit  Medication Sig Dispense Refill  . levothyroxine (SYNTHROID, LEVOTHROID) 50 MCG tablet Take 1 tablet (50 mcg total) by mouth daily.  30 tablet  3   No current facility-administered medications on file prior to visit.    Review of Systems:  As per HPI, otherwise negative.    Physical Examination: Filed Vitals:   10/25/12 1341  BP: 131/80  Pulse: 89  Temp: 97.5 F (36.4 C)  Resp: 16   Filed Vitals:   10/25/12 1341  Height: 5' 8.58" (1.742 m)  Weight: 199 lb 6.4 oz (90.447 kg)   Body mass index is 29.81 kg/(m^2). Ideal Body Weight: Weight in (lb) to have BMI = 25: 166.9   GEN: WDWN, NAD, Non-toxic, Alert & Oriented x 3 HEENT: Atraumatic, Normocephalic.  Ears and Nose: No external deformity. EXTR: No clubbing/cyanosis/edema NEURO: Normal gait.  PSYCH: Normally interactive. Conversant. Not depressed or  anxious appearing.  Calm demeanor.    Assessment and Plan: Hypothyroidism TSH   Signed,  Phillips Odor, MD

## 2012-10-25 NOTE — Patient Instructions (Addendum)

## 2012-10-26 MED ORDER — SYNTHROID 50 MCG PO TABS: 50.0000 ug | ORAL_TABLET | Freq: Every day | ORAL | Status: DC

## 2012-10-26 NOTE — Addendum Note (Signed)
Addended by: Carmelina Dane on: 10/26/2012 08:32 AM   Modules accepted: Orders

## 2012-10-30 ENCOUNTER — Telehealth: Payer: Self-pay

## 2012-10-30 NOTE — Telephone Encounter (Signed)
Pt is calling because she came in on Sunday and had some lab work done about her thyroid for her prescription and she hasn't heard anything back. Call back number is [phone] Pharmacy is [facility] on [address]

## 2012-10-30 NOTE — Telephone Encounter (Signed)
Lab normal, continue her current dose. Patient advised, called in.

## 2013-01-20 ENCOUNTER — Other Ambulatory Visit: Payer: Self-pay | Admitting: Family Medicine

## 2013-04-26 ENCOUNTER — Other Ambulatory Visit: Payer: Self-pay | Admitting: Physician Assistant

## 2013-05-20 ENCOUNTER — Ambulatory Visit (INDEPENDENT_AMBULATORY_CARE_PROVIDER_SITE_OTHER): Payer: PRIVATE HEALTH INSURANCE | Admitting: Family Medicine

## 2013-05-20 ENCOUNTER — Encounter: Payer: Self-pay | Admitting: Family Medicine

## 2013-05-20 VITALS — BP 130/74 | HR 70 | Temp 98.4°F | Resp 16 | Ht 69.5 in | Wt 205.0 lb

## 2013-05-20 DIAGNOSIS — E039 Hypothyroidism, unspecified: Secondary | ICD-10-CM | POA: Insufficient documentation

## 2013-05-20 DIAGNOSIS — R5381 Other malaise: Secondary | ICD-10-CM

## 2013-05-20 DIAGNOSIS — E663 Overweight: Secondary | ICD-10-CM | POA: Insufficient documentation

## 2013-05-20 DIAGNOSIS — Z3041 Encounter for surveillance of contraceptive pills: Secondary | ICD-10-CM | POA: Insufficient documentation

## 2013-05-20 LAB — COMPREHENSIVE METABOLIC PANEL
Albumin: 4.4 g/dL (ref 3.5–5.2)
BUN: 7 mg/dL (ref 6–23)
CO2: 26 mEq/L (ref 19–32)
Calcium: 9.5 mg/dL (ref 8.4–10.5)
Chloride: 102 mEq/L (ref 96–112)
Glucose, Bld: 80 mg/dL (ref 70–99)
Potassium: 4 mEq/L (ref 3.5–5.3)
Sodium: 138 mEq/L (ref 135–145)
Total Protein: 7.3 g/dL (ref 6.0–8.3)

## 2013-05-20 MED ORDER — SYNTHROID 50 MCG PO TABS: 50.0000 ug | ORAL_TABLET | Freq: Every day | ORAL | Status: DC

## 2013-05-20 MED ORDER — LEVOTHYROXINE SODIUM 50 MCG PO TABS: 50.0000 ug | ORAL_TABLET | Freq: Every day | ORAL | Status: DC

## 2013-05-20 NOTE — Progress Notes (Signed)
S:  This 22 y.o. Cauc female has hypothyroidism, diagnosed July 2013. Current medication- Levothyroxine 50 mcg seems to be stable; pt would prefer Synthroid brand so that she can feel secure in medication formulation. She still has some fatigue with sleep excess. Exercise- pt recently resumed regular physical activity to shed 20 lbs gained in last year.  PMHx and Soc Hx reviewed. Fam Hx- mother has thyroid disorder.  ROS: AS per HPI; negative for diaphoresis, appetite change, cardiac symptoms, edema, muscle cramps, weakness, constipation or syncope.  O: Filed Vitals:   05/20/13 1423  BP: 130/74  Pulse: 70  Temp: 98.4 F (36.9 C)  Resp: 16   GEN: In NAD: WN,WD. HENT: [place]/AT; EOMI w/ clear conj/sclerae. Otherwise unremarkable. COR: RRR. LUNGS: Unlabored resp. SKIN: W&D; intact w/o erythema, rashes or pallor. NEURO: A&O x 3; CNs intact. Nonfocal.  A/P: Unspecified hypothyroidism - Continue current dose of medication- specify SYNTHROID BRAND. Plan: T4, free, TSH, Comprehensive metabolic panel  Other malaise and fatigue - Plan: Vit D  25 hydroxy (rtn osteoporosis monitoring)

## 2013-05-20 NOTE — Patient Instructions (Signed)

## 2013-05-22 ENCOUNTER — Other Ambulatory Visit: Payer: Self-pay | Admitting: Family Medicine

## 2013-05-22 DIAGNOSIS — E039 Hypothyroidism, unspecified: Secondary | ICD-10-CM

## 2013-05-22 NOTE — Progress Notes (Signed)
Quick Note:  Please advise pt regarding following labs... TSH result indicates that thyroid medication dose needs to be increased just a little. I would suggest pt take 1 1/2 tablets (= 75 mcg) daily. Thyroid labs will need to be rechecked sooner than 6 months since a change is being made in medication dose; I will place an order for future lab at ~ 8 weeks. Please advise pt that it is important that she have these labs done around mid-January 2015.  Electrolytes, blood sugar, kidney and liver function are all normal. Vitamin D is a little below norma; get an OTC Vitamin D3 supplement 1000 IU and take is daily; increase foods like salmon, tuna, eggs, dairy products, mushrooms and try to get 10-15 minutes of sun most days of the week as weather permits.  Copy to pt. ______

## 2013-07-26 ENCOUNTER — Other Ambulatory Visit (INDEPENDENT_AMBULATORY_CARE_PROVIDER_SITE_OTHER): Payer: PRIVATE HEALTH INSURANCE | Admitting: *Deleted

## 2013-07-26 DIAGNOSIS — E039 Hypothyroidism, unspecified: Secondary | ICD-10-CM

## 2013-07-26 NOTE — Progress Notes (Signed)
Patient her for labs only.

## 2013-07-27 LAB — T4, FREE: Free T4: 1.04 ng/dL (ref 0.80–1.80)

## 2013-07-27 LAB — TSH: TSH: 3.687 u[IU]/mL (ref 0.350–4.500)

## 2013-07-27 LAB — T3, FREE: T3 FREE: 3 pg/mL (ref 2.3–4.2)

## 2013-07-27 NOTE — Progress Notes (Signed)
Quick Note:  Please notify pt that results are normal.   Provide pt with copy of labs. ______ 

## 2013-08-05 ENCOUNTER — Other Ambulatory Visit: Payer: Self-pay | Admitting: Family Medicine

## 2013-08-05 MED ORDER — SYNTHROID 50 MCG PO TABS: ORAL_TABLET | ORAL | Status: DC

## 2013-08-06 ENCOUNTER — Telehealth: Payer: Self-pay

## 2013-08-06 ENCOUNTER — Other Ambulatory Visit: Payer: Self-pay | Admitting: Radiology

## 2013-08-06 MED ORDER — LEVOTHYROXINE SODIUM 75 MCG PO TABS: 75.0000 ug | ORAL_TABLET | Freq: Every day | ORAL | Status: DC

## 2013-08-06 NOTE — Telephone Encounter (Signed)
Pt states that she would like her rx sent over for the brand name synthroid because he pharmacy gives her a hard time when its sent over as the generic. Best# [phone]

## 2013-08-06 NOTE — Telephone Encounter (Signed)
Synthroid 50 is expensive to buy and take one and 1/2 tablet. Patient requesting Rx to be resent for 75 mcg, this is done.

## 2013-08-08 ENCOUNTER — Telehealth: Payer: Self-pay

## 2013-08-08 ENCOUNTER — Other Ambulatory Visit: Payer: Self-pay | Admitting: Physician Assistant

## 2013-08-08 DIAGNOSIS — E039 Hypothyroidism, unspecified: Secondary | ICD-10-CM

## 2013-08-08 MED ORDER — LEVOTHYROXINE SODIUM 75 MCG PO TABS: 75.0000 ug | ORAL_TABLET | Freq: Every day | ORAL | Status: DC

## 2013-08-08 NOTE — Telephone Encounter (Signed)
This has been taken care of.

## 2013-08-08 NOTE — Telephone Encounter (Signed)
Resent Rx in for 90 days.

## 2013-10-25 ENCOUNTER — Other Ambulatory Visit: Payer: Self-pay | Admitting: Physician Assistant

## 2014-04-26 ENCOUNTER — Ambulatory Visit: Payer: PRIVATE HEALTH INSURANCE | Admitting: Family Medicine

## 2014-05-03 ENCOUNTER — Ambulatory Visit (INDEPENDENT_AMBULATORY_CARE_PROVIDER_SITE_OTHER): Payer: PRIVATE HEALTH INSURANCE | Admitting: Family Medicine

## 2014-05-03 ENCOUNTER — Encounter: Payer: Self-pay | Admitting: Family Medicine

## 2014-05-03 VITALS — BP 126/66 | HR 87 | Temp 99.2°F | Resp 16 | Ht 69.0 in | Wt 210.6 lb

## 2014-05-03 DIAGNOSIS — Z13220 Encounter for screening for lipoid disorders: Secondary | ICD-10-CM

## 2014-05-03 DIAGNOSIS — E039 Hypothyroidism, unspecified: Secondary | ICD-10-CM

## 2014-05-03 LAB — COMPREHENSIVE METABOLIC PANEL
ALT: 17 U/L (ref 0–35)
AST: 15 U/L (ref 0–37)
Albumin: 4.4 g/dL (ref 3.5–5.2)
Alkaline Phosphatase: 75 U/L (ref 39–117)
BILIRUBIN TOTAL: 0.5 mg/dL (ref 0.2–1.2)
BUN: 10 mg/dL (ref 6–23)
CHLORIDE: 102 meq/L (ref 96–112)
CO2: 26 meq/L (ref 19–32)
Calcium: 9.5 mg/dL (ref 8.4–10.5)
Creat: 0.69 mg/dL (ref 0.50–1.10)
Glucose, Bld: 84 mg/dL (ref 70–99)
Potassium: 4.3 mEq/L (ref 3.5–5.3)
SODIUM: 139 meq/L (ref 135–145)
TOTAL PROTEIN: 7.4 g/dL (ref 6.0–8.3)

## 2014-05-03 LAB — THYROID PANEL WITH TSH
FREE THYROXINE INDEX: 2.4 (ref 1.4–3.8)
T3 UPTAKE: 28 % (ref 22.0–35.0)
T4, Total: 8.5 ug/dL (ref 4.5–12.0)
TSH: 2.81 u[IU]/mL (ref 0.350–4.500)

## 2014-05-03 LAB — LIPID PANEL
CHOLESTEROL: 183 mg/dL (ref 0–200)
HDL: 53 mg/dL (ref >39)
LDL Cholesterol: 99 mg/dL (ref 0–99)
Total CHOL/HDL Ratio: 3.5 Ratio
Triglycerides: 157 mg/dL — ABNORMAL HIGH (ref <150)
VLDL: 31 mg/dL (ref 0–40)

## 2014-05-03 MED ORDER — SYNTHROID 75 MCG PO TABS: ORAL_TABLET | ORAL | Status: DC

## 2014-05-03 NOTE — Patient Instructions (Signed)
Exercise to Lose Weight Exercise and a healthy diet may help you lose weight. Your doctor may suggest specific exercises. EXERCISE IDEAS AND TIPS  Choose low-cost things you enjoy doing, such as walking, bicycling, or exercising to workout videos.  Take stairs instead of the elevator.  Walk during your lunch break.  Park your car further away from work or school.  Go to a gym or an exercise class.  Start with 5 to 10 minutes of exercise each day. Build up to 30 minutes of exercise 4 to 6 days a week.  Wear shoes with good support and comfortable clothes.  Stretch before and after working out.  Work out until you breathe harder and your heart beats faster.  Drink extra water when you exercise.  Do not do so much that you hurt yourself, feel dizzy, or get very short of breath. Exercises that burn about 150 calories:  Running 1  miles in 15 minutes.  Playing volleyball for 45 to 60 minutes.  Washing and waxing a car for 45 to 60 minutes.  Playing touch football for 45 minutes.  Walking 1  miles in 35 minutes.  Pushing a stroller 1  miles in 30 minutes.  Playing basketball for 30 minutes.  Raking leaves for 30 minutes.  Bicycling 5 miles in 30 minutes.  Walking 2 miles in 30 minutes.  Dancing for 30 minutes.  Shoveling snow for 15 minutes.  Swimming laps for 20 minutes.  Walking up stairs for 15 minutes.  Bicycling 4 miles in 15 minutes.  Gardening for 30 to 45 minutes.  Jumping rope for 15 minutes.  Washing windows or floors for 45 to 60 minutes. Document Released: 07/27/2010 Document Revised: 09/16/2011 Document Reviewed: 07/27/2010 [employer] Patient Information 2015 [employer]. This information is not intended to replace advice given to you by your health care provider. Make sure you discuss any questions you have with your health care provider.       Mediterranean Diet  Why follow it? Research shows. . Those who follow the Mediterranean diet  have a reduced risk of heart disease  . The diet is associated with a reduced incidence of Parkinson's and Alzheimer's diseases . People following the diet may have longer life expectancies and lower rates of chronic diseases  . The Dietary Guidelines for Americans recommends the Mediterranean diet as an eating plan to promote health and prevent disease  What Is the Mediterranean Diet?  . Healthy eating plan based on typical foods and recipes of Mediterranean-style cooking . The diet is primarily a plant based diet; these foods should make up a majority of meals   Starches - Plant based foods should make up a majority of meals - They are an important sources of vitamins, minerals, energy, antioxidants, and fiber - Choose whole grains, foods high in fiber and minimally processed items  - Typical grain sources include wheat, oats, barley, corn, brown rice, bulgar, farro, millet, polenta, couscous  - Various types of beans include chickpeas, lentils, fava beans, black beans, white beans   Fruits  Veggies - Large quantities of antioxidant rich fruits & veggies; 6 or more servings  - Vegetables can be eaten raw or lightly drizzled with oil and cooked  - Vegetables common to the traditional Mediterranean Diet include: artichokes, arugula, beets, broccoli, brussel sprouts, cabbage, carrots, celery, collard greens, cucumbers, eggplant, kale, leeks, lemons, lettuce, mushrooms, okra, onions, peas, peppers, potatoes, pumpkin, radishes, rutabaga, shallots, spinach, sweet potatoes, turnips, zucchini - Fruits common to the   Mediterranean Diet include: apples, apricots, avocados, cherries, clementines, dates, figs, grapefruits, grapes, melons, nectarines, oranges, peaches, pears, pomegranates, strawberries, tangerines  Fats - Replace butter and margarine with healthy oils, such as olive oil, canola oil, and tahini  - Limit nuts to no more than a handful a day  - Nuts include walnuts, almonds, pecans, pistachios,  pine nuts  - Limit or avoid candied, honey roasted or heavily salted nuts - Olives are central to the Mediterranean diet - can be eaten whole or used in a variety of dishes   Meats Protein - Limiting red meat: no more than a few times a month - When eating red meat: choose lean cuts and keep the portion to the size of deck of cards - Eggs: approx. 0 to 4 times a week  - Fish and lean poultry: at least 2 a week  - Healthy protein sources include, chicken, turkey, lean beef, lamb - Increase intake of seafood such as tuna, salmon, trout, mackerel, shrimp, scallops - Avoid or limit high fat processed meats such as sausage and bacon  Dairy - Include moderate amounts of low fat dairy products  - Focus on healthy dairy such as fat free yogurt, skim milk, low or reduced fat cheese - Limit dairy products higher in fat such as whole or 2% milk, cheese, ice cream  Alcohol - Moderate amounts of red wine is ok  - No more than 5 oz daily for women (all ages) and men older than age 65  - No more than 10 oz of wine daily for men younger than 65  Other - Limit sweets and other desserts  - Use herbs and spices instead of salt to flavor foods  - Herbs and spices common to the traditional Mediterranean Diet include: basil, bay leaves, chives, cloves, cumin, fennel, garlic, lavender, marjoram, mint, oregano, parsley, pepper, rosemary, sage, savory, sumac, tarragon, thyme   It's not just a diet, it's a lifestyle:  . The Mediterranean diet includes lifestyle factors typical of those in the region  . Foods, drinks and meals are best eaten with others and savored . Daily physical activity is important for overall good health . This could be strenuous exercise like running and aerobics . This could also be more leisurely activities such as walking, housework, yard-work, or taking the stairs . Moderation is the key; a balanced and healthy diet accommodates most foods and drinks . Consider portion sizes and frequency  of consumption of certain foods   Meal Ideas & Options:  . Breakfast:  o Whole wheat toast or whole wheat English muffins with peanut butter & hard boiled egg o Steel cut oats topped with apples & cinnamon and skim milk  o Fresh fruit: banana, strawberries, melon, berries, peaches  o Smoothies: strawberries, bananas, greek yogurt, peanut butter o Low fat greek yogurt with blueberries and granola  o Egg white omelet with spinach and mushrooms o Breakfast couscous: whole wheat couscous, apricots, skim milk, cranberries  . Sandwiches:  o Hummus and grilled vegetables (peppers, zucchini, squash) on whole wheat bread   o Grilled chicken on whole wheat pita with lettuce, tomatoes, cucumbers or tzatziki  o Tuna salad on whole wheat bread: tuna salad made with greek yogurt, olives, red peppers, capers, green onions o Garlic rosemary lamb pita: lamb sauted with garlic, rosemary, salt & pepper; add lettuce, cucumber, greek yogurt to pita - flavor with lemon juice and black pepper  . Seafood:  o Mediterranean grilled salmon, seasoned with garlic,   basil, parsley, lemon juice and black pepper o Shrimp, lemon, and spinach whole-grain pasta salad made with low fat greek yogurt  o Seared scallops with lemon orzo  o Seared tuna steaks seasoned salt, pepper, coriander topped with tomato mixture of olives, tomatoes, olive oil, minced garlic, parsley, green onions and cappers  . Meats:  o Herbed greek chicken salad with kalamata olives, cucumber, feta  o Red bell peppers stuffed with spinach, bulgur, lean ground beef (or lentils) & topped with feta   o Kebabs: skewers of chicken, tomatoes, onions, zucchini, squash  o Turkey burgers: made with red onions, mint, dill, lemon juice, feta cheese topped with roasted red peppers . Vegetarian o Cucumber salad: cucumbers, artichoke hearts, celery, red onion, feta cheese, tossed in olive oil & lemon juice  o Hummus and whole grain pita points with a greek salad  (lettuce, tomato, feta, olives, cucumbers, red onion) o Lentil soup with celery, carrots made with vegetable broth, garlic, salt and pepper  o Tabouli salad: parsley, bulgur, mint, scallions, cucumbers, tomato, radishes, lemon juice, olive oil, salt and pepper. o  

## 2014-05-03 NOTE — Progress Notes (Signed)
S:  This 22 y.o. Female has hypothyroidism, treated w/ brand Synthroid. She feels well and thinks her dose is adequate. She has been exercising and modifying nutrition. She graduated from undergraduate education and now has a Network engineer job. She plans to apply to graduate school to get an advanced degree in economics; she wants to teach at the college level.   Patient Active Problem List   Diagnosis Date Noted  . Unspecified hypothyroidism 05/20/2013  . Overweight (BMI 25.0-29.9) 05/20/2013  . Uses oral contraception 05/20/2013    Prior to Admission medications   Medication Sig Start Date End Date Taking? Authorizing Provider  SYNTHROID 75 MCG tablet TAKE 1 TABLET BY MOUTH EVERY DAY BEFORE BREAKFAST   Yes Barton Fanny, MD    History   Social History  . Marital Status: Single    Spouse Name: N/A    Number of Children: N/A  . Years of Education: N/A   Occupational History  . Not on file.   Social History Main Topics  . Smoking status: Never Smoker   . Smokeless tobacco: Not on file  . Alcohol Use: 0.6 oz/week    1 Glasses of wine per week  . Drug Use: No  . Sexual Activity: Yes    Birth Control/ Protection: None   Other Topics Concern  . Not on file   Social History Narrative  . No narrative on file    FAMILY HX: Mother has thyroid disorder.  ROS: Noncontributory.  O: Filed Vitals:   05/03/14 1431  BP: 126/66  Pulse: 87  Temp: 99.2 F (37.3 C)  Resp: 16    GEN: In NAD; WN,WD. HENT: [place]/AT; EOMI w/ clear conj/sclerae. Otherwise normal. NECK: Supple w/o LAN or TMG. COR: RRR. LUNGS: CTA; Normal resp rate and effort. SKIN: W&D; no diaphoresis, erythema or rashes. NEURO: A&O x 3; CNs intact. DTRs 1-2+/=. Nonfocal.  A/P: Hypothyroidism, unspecified hypothyroidism type - Stable on current dose of Synthroid; continue same. Plan: Comprehensive metabolic panel, Thyroid Panel With TSH  Screening for hyperlipidemia - Plan: Lipid panel  Meds ordered this  encounter  Medications  . SYNTHROID 75 MCG tablet    Sig: TAKE 1 TABLET BY MOUTH EVERY DAY BEFORE BREAKFAST    Dispense:  90 tablet    Refill:  1

## 2014-05-04 ENCOUNTER — Other Ambulatory Visit: Payer: Self-pay | Admitting: Physician Assistant

## 2014-05-05 NOTE — Progress Notes (Signed)
Quick Note:  Please notify pt that results are normal.   Provide pt with copy of labs. ______ 

## 2014-05-09 ENCOUNTER — Encounter: Payer: Self-pay | Admitting: Family Medicine

## 2014-09-14 ENCOUNTER — Other Ambulatory Visit: Payer: Self-pay | Admitting: Internal Medicine

## 2014-09-14 DIAGNOSIS — E039 Hypothyroidism, unspecified: Secondary | ICD-10-CM

## 2014-09-23 ENCOUNTER — Ambulatory Visit (HOSPITAL_COMMUNITY)
Admission: RE | Admit: 2014-09-23 | Discharge: 2014-09-23 | Disposition: A | Discharge status: Home / Self Care | Payer: PRIVATE HEALTH INSURANCE | Source: Ambulatory Visit | Attending: Internal Medicine | Admitting: Internal Medicine

## 2014-09-23 DIAGNOSIS — E041 Nontoxic single thyroid nodule: Secondary | ICD-10-CM | POA: Diagnosis not present

## 2014-09-23 DIAGNOSIS — E039 Hypothyroidism, unspecified: Secondary | ICD-10-CM

## 2014-10-28 ENCOUNTER — Other Ambulatory Visit: Payer: Self-pay | Admitting: Family Medicine

## 2014-11-02 ENCOUNTER — Ambulatory Visit: Payer: PRIVATE HEALTH INSURANCE | Admitting: Family Medicine

## 2015-05-10 ENCOUNTER — Ambulatory Visit (INDEPENDENT_AMBULATORY_CARE_PROVIDER_SITE_OTHER): Payer: PRIVATE HEALTH INSURANCE | Admitting: Internal Medicine

## 2015-05-10 ENCOUNTER — Ambulatory Visit (INDEPENDENT_AMBULATORY_CARE_PROVIDER_SITE_OTHER): Payer: PRIVATE HEALTH INSURANCE

## 2015-05-10 VITALS — BP 124/74 | HR 87 | Temp 98.3°F | Resp 17 | Ht 70.0 in | Wt 224.0 lb

## 2015-05-10 DIAGNOSIS — R7611 Nonspecific reaction to tuberculin skin test without active tuberculosis: Secondary | ICD-10-CM

## 2015-05-10 DIAGNOSIS — Z0000 Encounter for general adult medical examination without abnormal findings: Secondary | ICD-10-CM

## 2015-05-10 NOTE — Patient Instructions (Signed)
Immunization Schedule, Adult  Influenza vaccine.  All adults should be immunized every year.  All adults, including pregnant women and people with hives-only allergy to eggs can receive the inactivated influenza (IIV) vaccine.  Adults aged 25-49 years can receive the recombinant influenza (RIV) vaccine. The RIV vaccine does not contain any egg protein.  Adults aged 76 years or older can receive the standard-dose IIV or the high-dose IIV.  Tetanus, diphtheria, and acellular pertussis (Td, Tdap) vaccine.  Pregnant women should receive 1 dose of Tdap vaccine during each pregnancy. The dose should be obtained regardless of the length of time since the last dose. Immunization is preferred during the 27th to 36th week of gestation.  An adult who has not previously received Tdap or who does not know his or her vaccine status should receive 1 dose of Tdap. This initial dose should be followed by tetanus and diphtheria toxoids (Td) booster doses every 10 years.  Adults with an unknown or incomplete history of completing a 3-dose immunization series with Td-containing vaccines should begin or complete a primary immunization series including a Tdap dose.  Adults should receive a Td booster every 10 years.  Varicella vaccine.  An adult without evidence of immunity to varicella should receive 2 doses or a second dose if he or she has previously received 1 dose.  Pregnant females who do not have evidence of immunity should receive the first dose after pregnancy. This first dose should be obtained before leaving the health care facility. The second dose should be obtained 4-8 weeks after the first dose.  Human papillomavirus (HPV) vaccine.  Females aged 13-26 years who have not received the vaccine previously should obtain the 3-dose series.  The vaccine is not recommended for use in pregnant females. However, pregnancy testing is not needed before receiving a dose. If a female is found to be  pregnant after receiving a dose, no treatment is needed. In that case, the remaining doses should be delayed until after the pregnancy.  Males aged 37-21 years who have not received the vaccine previously should receive the 3-dose series. Males aged 22-26 years may be immunized.  Immunization is recommended through the age of 93 years for any female who has sex with males and did not get any or all doses earlier.  Immunization is recommended for any person with an immunocompromised condition through the age of 71 years if he or she did not get any or all doses earlier.  During the 3-dose series, the second dose should be obtained 4-8 weeks after the first dose. The third dose should be obtained 24 weeks after the first dose and 16 weeks after the second dose.  Zoster vaccine.  One dose is recommended for adults aged 73 years or older unless certain conditions are present.  Measles, mumps, and rubella (MMR) vaccine.  Adults born before 35 generally are considered immune to measles and mumps.  Adults born in [birth year] or later should have 1 or more doses of MMR vaccine unless there is a contraindication to the vaccine or there is laboratory evidence of immunity to each of the three diseases.  A routine second dose of MMR vaccine should be obtained at least 28 days after the first dose for students attending postsecondary schools, health care workers, or international travelers.  People who received inactivated measles vaccine or an unknown type of measles vaccine during 1963-1967 should receive 2 doses of MMR vaccine.  People who received inactivated mumps vaccine or an unknown type  of mumps vaccine before 1979 and are at high risk for mumps infection should consider immunization with 2 doses of MMR vaccine.  For females of childbearing age, rubella immunity should be determined. If there is no evidence of immunity, females who are not pregnant should be vaccinated. If there is no evidence of  immunity, females who are pregnant should delay immunization until after pregnancy.  Unvaccinated health care workers born before 1957 who lack laboratory evidence of measles, mumps, or rubella immunity or laboratory confirmation of disease should consider measles and mumps immunization with 2 doses of MMR vaccine or rubella immunization with 1 dose of MMR vaccine.  Pneumococcal 13-valent conjugate (PCV13) vaccine.  When indicated, a person who is uncertain of his or her immunization history and has no record of immunization should receive the PCV13 vaccine.  An adult aged 19 years or older who has certain medical conditions and has not been previously immunized should receive 1 dose of PCV13 vaccine. This PCV13 should be followed with a dose of pneumococcal polysaccharide (PPSV23) vaccine. The PPSV23 vaccine dose should be obtained at least 8 weeks after the dose of PCV13 vaccine.  An adult aged 19 years or older who has certain medical conditions and previously received 1 or more doses of PPSV23 vaccine should receive 1 dose of PCV13. The PCV13 vaccine dose should be obtained 1 or more years after the last PPSV23 vaccine dose.  Pneumococcal polysaccharide (PPSV23) vaccine.  When PCV13 is also indicated, PCV13 should be obtained first.  All adults aged 65 years and older should be immunized.  An adult younger than age 65 years who has certain medical conditions should be immunized.  Any person who resides in a nursing home or long-term care facility should be immunized.  An adult smoker should be immunized.  People with an immunocompromised condition and certain other conditions should receive both PCV13 and PPSV23 vaccines.  People with human immunodeficiency virus (HIV) infection should be immunized as soon as possible after diagnosis.  Immunization during chemotherapy or radiation therapy should be avoided.  Routine use of PPSV23 vaccine is not recommended for American Indians,  [place] Natives, or people younger than 65 years unless there are medical conditions that require PPSV23 vaccine.  When indicated, people who have unknown immunization and have no record of immunization should receive PPSV23 vaccine.  One-time revaccination 5 years after the first dose of PPSV23 is recommended for people aged 19-64 years who have chronic kidney failure, nephrotic syndrome, asplenia, or immunocompromised conditions.  People who received 1-2 doses of PPSV23 before age 65 years should receive another dose of PPSV23 vaccine at age 65 years or later if at least 5 years have passed since the previous dose.  Doses of PPSV23 are not needed for people immunized with PPSV23 at or after age 65 years.  Meningococcal vaccine.  Adults with asplenia or persistent complement component deficiencies should receive 2 doses of quadrivalent meningococcal conjugate (MenACWY-D) vaccine. The doses should be obtained at least 2 months apart.  Microbiologists working with certain meningococcal bacteria, military recruits, people at risk during an outbreak, and people who travel to or live in countries with a high rate of meningitis should be immunized.  A first-year college student up through age 21 years who is living in a residence hall should receive a dose if he or she did not receive a dose on or after his or her 16th birthday.  Adults who have certain high-risk conditions should receive one or more doses   be obtained at least 2 months apart.    Microbiologists working with certain meningococcal bacteria, military recruits, people at risk during an outbreak, and people who travel to or live in countries with a high rate of meningitis should be immunized.    A first-year college student up through age 21 years who is living in a residence hall should receive a dose if he or she did not receive a dose on or after his or her 16th birthday.    Adults who have certain high-risk conditions should receive one or more doses of vaccine.  · Hepatitis A vaccine.    Adults who wish to be protected from this disease, have certain high-risk conditions, work with hepatitis A-infected animals, work in hepatitis A research labs, or travel to or work in countries with a high rate of hepatitis A should be immunized.    Adults who were previously unvaccinated and who anticipate close contact with an international adoptee during the first 60 days after arrival in the [place] from a country with a high rate of hepatitis A should  be immunized.  · Hepatitis B vaccine.    Adults who wish to be protected from this disease, have certain high-risk conditions, may be exposed to blood or other infectious body fluids, are household contacts or sex partners of hepatitis B positive people, are clients or workers in certain care facilities, or travel to or work in countries with a high rate of hepatitis B should be immunized.  · Haemophilus influenzae type b (Hib) vaccine.    A previously unvaccinated person with asplenia or sickle cell disease or having a scheduled splenectomy should receive 1 dose of Hib vaccine.    Regardless of previous immunization, a recipient of a hematopoietic stem cell transplant should receive a 3-dose series 6-12 months after his or her successful transplant.    Hib vaccine is not recommended for adults with HIV infection.     This information is not intended to replace advice given to you by your health care provider. Make sure you discuss any questions you have with your health care provider.     Document Released: 09/14/2003 Document Revised: 10/19/2012 Document Reviewed: 08/11/2012  Elsevier Interactive Patient Education ©2016 [employer]

## 2015-05-10 NOTE — Progress Notes (Signed)
Patient ID: Alyssa Bryan, female   DOB: 21-Mar-1991, 24 y.o.   MRN: [id number]   05/10/2015 at 8:37 AM  Alyssa Bryan / DOB: 18-Apr-1991 / MRN: [id number]  Problem list reviewed and updated by me where necessary.   SUBJECTIVE  Alyssa Bryan is a 24 y.o. well appearing female presenting for the chief complaint of need a physical for public school..     She  has a past medical history of Thyroid disease and No pertinent past medical history.    Medications reviewed and updated by myself where necessary, and exist elsewhere in the encounter.   Alyssa Bryan has No Known Allergies. She  reports that she has never smoked. She does not have any smokeless tobacco history on file. She reports that she drinks about 0.6 oz of alcohol per week. She reports that she does not use illicit drugs. She  reports that she currently engages in sexual activity. She reports using the following method of birth control/protection: None. The patient  has past surgical history that includes Hernia repair.  Her family history includes Mental retardation in her maternal grandmother.  Review of Systems  Constitutional: Negative.  Negative for fever.  HENT: Negative.   Eyes: Negative.   Respiratory: Negative.  Negative for shortness of breath.   Cardiovascular: Negative.  Negative for chest pain.  Gastrointestinal: Negative.  Negative for nausea.  Genitourinary: Negative.   Musculoskeletal: Negative.   Skin: Negative.  Negative for rash.  Neurological: Negative.  Negative for dizziness and headaches.  Endo/Heme/Allergies: Negative.   Psychiatric/Behavioral: Negative.     OBJECTIVE  Her  height is 5\' 10"  (1.778 m) and weight is 224 lb (101.606 kg). Her oral temperature is 98.3 F (36.8 C). Her blood pressure is 124/74 and her pulse is 87. Her respiration is 17 and oxygen saturation is 97%.  The patient's body mass index is 32.14 kg/(m^2).  Physical Exam  Constitutional: She is oriented to person,  place, and time. She appears well-developed and well-nourished.  HENT:  Head: Normocephalic.  Right Ear: External ear normal.  Left Ear: External ear normal.  Eyes: Conjunctivae and EOM are normal. Pupils are equal, round, and reactive to light. No scleral icterus.  Neck: Normal range of motion. Neck supple.  Cardiovascular: Normal rate, regular rhythm and normal heart sounds.   Respiratory: Effort normal and breath sounds normal.  GI: Soft. She exhibits no distension.  Musculoskeletal: Normal range of motion.  Neurological: She is alert and oriented to person, place, and time. No cranial nerve deficit. Coordination normal.  Skin: Skin is warm and dry. No rash noted.  Psychiatric: She has a normal mood and affect. Her behavior is normal. Judgment and thought content normal.   [facility] reading (PRIMARY) by  Dr.Guest cxr clear of infection    No results found for this or any previous visit (from the past 24 hour(s)).  ASSESSMENT & PLAN  [name] was seen today for employment physical.  Diagnoses and all orders for this visit:  Positive TB test -     DG Chest 1 View; Future

## 2016-07-17 ENCOUNTER — Ambulatory Visit (INDEPENDENT_AMBULATORY_CARE_PROVIDER_SITE_OTHER): Payer: Managed Care, Other (non HMO) | Admitting: Urgent Care

## 2016-07-17 VITALS — BP 116/70 | HR 100 | Temp 98.5°F | Resp 16 | Ht 69.5 in | Wt 223.0 lb

## 2016-07-17 DIAGNOSIS — R05 Cough: Secondary | ICD-10-CM

## 2016-07-17 DIAGNOSIS — J029 Acute pharyngitis, unspecified: Secondary | ICD-10-CM

## 2016-07-17 DIAGNOSIS — R0981 Nasal congestion: Secondary | ICD-10-CM | POA: Diagnosis not present

## 2016-07-17 DIAGNOSIS — R52 Pain, unspecified: Secondary | ICD-10-CM

## 2016-07-17 DIAGNOSIS — R059 Cough, unspecified: Secondary | ICD-10-CM

## 2016-07-17 MED ORDER — PSEUDOEPHEDRINE HCL ER 120 MG PO TB12: 120.0000 mg | ORAL_TABLET | Freq: Two times a day (BID) | ORAL | 3 refills | Status: DC

## 2016-07-17 MED ORDER — BENZONATATE 100 MG PO CAPS: 100.0000 mg | ORAL_CAPSULE | Freq: Three times a day (TID) | ORAL | 0 refills | Status: DC | PRN

## 2016-07-17 MED ORDER — HYDROCODONE-HOMATROPINE 5-1.5 MG/5ML PO SYRP: 5.0000 mL | ORAL_SOLUTION | Freq: Every evening | ORAL | 0 refills | Status: DC | PRN

## 2016-07-17 NOTE — Patient Instructions (Addendum)
Influenza, Adult Influenza ("the flu") is an infection in the lungs, nose, and throat (respiratory tract). It is caused by a virus. The flu causes many common cold symptoms, as well as a high fever and body aches. It can make you feel very sick. The flu spreads easily from person to person (is contagious). Getting a flu shot (influenza vaccination) every year is the best way to prevent the flu. Follow these instructions at home:  Take over-the-counter and prescription medicines only as told by your doctor.  Use a cool mist humidifier to add moisture (humidity) to the air in your home. This can make it easier to breathe.  Rest as needed.  Drink enough fluid to keep your pee (urine) clear or pale yellow.  Cover your mouth and nose when you cough or sneeze.  Wash your hands with soap and water often, especially after you cough or sneeze. If you cannot use soap and water, use hand sanitizer.  Stay home from work or school as told by your doctor. Unless you are visiting your doctor, try to avoid leaving home until your fever has been gone for 24 hours without the use of medicine.  Keep all follow-up visits as told by your doctor. This is important. How is this prevented?  Getting a yearly (annual) flu shot is the best way to avoid getting the flu. You may get the flu shot in late summer, fall, or winter. Ask your doctor when you should get your flu shot.  Wash your hands often or use hand sanitizer often.  Avoid contact with people who are sick during cold and flu season.  Eat healthy foods.  Drink plenty of fluids.  Get enough sleep.  Exercise regularly. Contact a doctor if:  You get new symptoms.  You have: ? Chest pain. ? Watery poop (diarrhea). ? A fever.  Your cough gets worse.  You start to have more mucus.  You feel sick to your stomach (nauseous).  You throw up (vomit). Get help right away if:  You start to be short of breath or have trouble breathing.  Your  skin or nails turn a bluish color.  You have very bad pain or stiffness in your neck.  You get a sudden headache.  You get sudden pain in your face or ear.  You cannot stop throwing up. This information is not intended to replace advice given to you by your health care provider. Make sure you discuss any questions you have with your health care provider. Document Released: 04/02/2008 Document Revised: 11/30/2015 Document Reviewed: 04/18/2015 Elsevier Interactive Patient Education  2017 Elsevier Inc.     IF you received an x-ray today, you will receive an invoice from [facility]. Please contact  Radiology at [phone] with questions or concerns regarding your invoice.   IF you received labwork today, you will receive an invoice from [facility]. Please contact [facility] at [phone] with questions or concerns regarding your invoice.   Our billing staff will not be able to assist you with questions regarding bills from these companies.  You will be contacted with the lab results as soon as they are available. The fastest way to get your results is to activate your My Chart account. Instructions are located on the last page of this paperwork. If you have not heard from us regarding the results in 2 weeks, please contact this office.      

## 2016-07-17 NOTE — Progress Notes (Signed)
    MRN: [id number] DOB: [birth year]  Subjective:   Alyssa Bryan is a 26 y.o. female presenting for chief complaint of Flu like symptoms  Reports 3 day history of subjective fever, chills, sore throat, bilateral ear pain, sinus pressure and pain, body aches, headache, productive, decreased appetite, nausea. Has tried NyQuil, hydrating well. Denies chest pain, shob, wheezing, vomiting, abdominal pain, rashes. Denies history of allergies, asthma. Denies smoking cigarettes. Patient did not get flu shot this season.  Alyssa Bryan has a current medication list which includes the following prescription(s): levothyroxine. Also has No Known Allergies.  Alyssa Bryan  has a past medical history of No pertinent past medical history and Thyroid disease. Also  has a past surgical history that includes Hernia repair.  Objective:   Vitals: BP 116/70   Pulse 100   Temp 98.5 F (36.9 C) (Oral)   Resp 16   Ht 5' 9.5" (1.765 m)   Wt 223 lb (101.2 kg)   LMP 07/08/2016   SpO2 97%   BMI 32.46 kg/m   Physical Exam  Constitutional: She is oriented to person, place, and time. She appears well-developed and well-nourished.  HENT:  TM's intact bilaterally, no effusions or erythema. Nasal turbinates pink and dry, nasal passages patent. No sinus tenderness. Oropharynx clear, mucous membranes moist, dentition in good repair.  Eyes: Right eye exhibits no discharge. Left eye exhibits no discharge.  Neck: Normal range of motion. Neck supple.  Cardiovascular: Normal rate, regular rhythm and intact distal pulses.  Exam reveals no gallop and no friction rub.   No murmur heard. Pulmonary/Chest: No respiratory distress. She has no wheezes. She has no rales.  Lymphadenopathy:    She has no cervical adenopathy.  Neurological: She is alert and oriented to person, place, and time.  Skin: Skin is warm and dry.   Assessment and Plan :   1. Body aches 2. Cough 3. Sore throat 4. Nasal congestion - Likely viral in nature,  advised supportive care. - If no improvement or symptoms do not resolve return to clinic in 1 week.   Jaynee Eagles, PA-C [facility] [phone] 07/17/2016 8:07 AM

## 2016-07-19 LAB — INFLUENZA A AND B
INFLUENZA B AG, EIA: NEGATIVE
Influenza A Ag, EIA: POSITIVE — AB

## 2016-09-03 ENCOUNTER — Ambulatory Visit (INDEPENDENT_AMBULATORY_CARE_PROVIDER_SITE_OTHER): Payer: Managed Care, Other (non HMO) | Admitting: Physician Assistant

## 2016-09-03 ENCOUNTER — Ambulatory Visit (INDEPENDENT_AMBULATORY_CARE_PROVIDER_SITE_OTHER): Payer: Managed Care, Other (non HMO)

## 2016-09-03 VITALS — BP 142/94 | HR 90 | Temp 98.4°F | Resp 16 | Ht 69.5 in | Wt 224.6 lb

## 2016-09-03 DIAGNOSIS — R0789 Other chest pain: Secondary | ICD-10-CM

## 2016-09-03 LAB — POCT URINALYSIS DIP (MANUAL ENTRY)
BILIRUBIN UA: NEGATIVE
Blood, UA: NEGATIVE
Glucose, UA: NEGATIVE
Ketones, POC UA: NEGATIVE
Leukocytes, UA: NEGATIVE
NITRITE UA: NEGATIVE
Protein Ur, POC: NEGATIVE
UROBILINOGEN UA: 0.2
pH, UA: 6

## 2016-09-03 LAB — POCT CBC
GRANULOCYTE PERCENT: 66.6 % (ref 37–80)
HEMATOCRIT: 38.9 % (ref 37.7–47.9)
Hemoglobin: 13.6 g/dL (ref 12.2–16.2)
Lymph, poc: 2.4 (ref 0.6–3.4)
MCH, POC: 30.7 pg (ref 27–31.2)
MCHC: 34.9 g/dL (ref 31.8–35.4)
MCV: 88 fL (ref 80–97)
MID (CBC): 0.4 (ref 0–0.9)
MPV: 8.1 fL (ref 0–99.8)
PLATELET COUNT, POC: 327 10*3/uL (ref 142–424)
POC Granulocyte: 5.6 (ref 2–6.9)
POC LYMPH %: 28.6 % (ref 10–50)
POC MID %: 4.8 % (ref 0–12)
RBC: 4.42 M/uL (ref 4.04–5.48)
RDW, POC: 12.8 %
WBC: 8.4 10*3/uL (ref 4.6–10.2)

## 2016-09-03 LAB — POCT URINE PREGNANCY: PREG TEST UR: NEGATIVE

## 2016-09-03 MED ORDER — NAPROXEN 500 MG PO TABS: 500.0000 mg | ORAL_TABLET | Freq: Two times a day (BID) | ORAL | 0 refills | Status: DC

## 2016-09-03 NOTE — Progress Notes (Signed)
09/03/2016 3:37 PM   DOB: 08-14-1990 / MRN: [id number]  SUBJECTIVE:  Alyssa Bryan is a 26 y.o. female presenting for pain in her chest that started about 10 days.  She describes this as a pressure and associates SOB and diaphoresis. The pain last thirty seconds. She feels that she has to take a deep breath to "get enough air." She takes no form of birth control. No history of recent history of trauma, surgery, posterior calf pain, pain with walking, or leg swelling. NO CONTRACEPTION.  The pain does go to her neck but does not travel to her arms.  Denies nausea.  She is eating well.   No family history of heart disease, HTN, dyslipidemia, diabetes.  Never smoker.   Going up stairs or eating makes the pain worse. She did have the flu in on January 10th but made a full recovery from that.   She has No Known Allergies.   She  has a past medical history of No pertinent past medical history and Thyroid disease.    She  reports that she has never smoked. She has never used smokeless tobacco. She reports that she drinks about 0.6 oz of alcohol per week . She reports that she does not use drugs. She  reports that she currently engages in sexual activity. She reports using the following method of birth control/protection: None. The patient  has a past surgical history that includes Hernia repair.  Her family history includes Mental retardation in her maternal grandmother.  Review of Systems  Constitutional: Negative for fever.    The problem list and medications were reviewed and updated by myself where necessary and exist elsewhere in the encounter.   OBJECTIVE:  BP (!) 142/94 (BP Location: Right Arm, Patient Position: Sitting, Cuff Size: Small)   Pulse 90   Temp 98.4 F (36.9 C) (Oral)   Resp 16   Ht 5' 9.5" (1.765 m)   Wt 224 lb 9.6 oz (101.9 kg)   LMP 08/09/2016   SpO2 98%   BMI 32.69 kg/m   Physical Exam  Constitutional: She is oriented to person, place, and time. She appears  well-developed and well-nourished.  Cardiovascular: Normal rate, regular rhythm, normal heart sounds and intact distal pulses.  Exam reveals no gallop and no friction rub.   No murmur heard. Pulmonary/Chest: Effort normal and breath sounds normal. No respiratory distress. She has no wheezes. She has no rales.    Neurological: She is alert and oriented to person, place, and time.  Skin: Skin is warm and dry.  Psychiatric: She has a normal mood and affect.    Lab Results  Component Value Date   TSH 2.810 05/03/2014   Results for orders placed or performed in visit on 09/03/16 (from the past 72 hour(s))  POCT CBC     Status: None   Collection Time: 09/03/16  3:14 PM  Result Value Ref Range   WBC 8.4 4.6 - 10.2 K/uL   Lymph, poc 2.4 0.6 - 3.4   POC LYMPH PERCENT 28.6 10 - 50 %L   MID (cbc) 0.4 0 - 0.9   POC MID % 4.8 0 - 12 %M   POC Granulocyte 5.6 2 - 6.9   Granulocyte percent 66.6 37 - 80 %G   RBC 4.42 4.04 - 5.48 M/uL   Hemoglobin 13.6 12.2 - 16.2 g/dL   HCT, POC 38.9 37.7 - 47.9 %   MCV 88.0 80 - 97 fL   MCH, POC 30.7 27 -  31.2 pg   MCHC 34.9 31.8 - 35.4 g/dL   RDW, POC 12.8 %   Platelet Count, POC 327 142 - 424 K/uL   MPV 8.1 0 - 99.8 fL  POCT urinalysis dipstick     Status: None   Collection Time: 09/03/16  3:20 PM  Result Value Ref Range   Color, UA yellow yellow   Clarity, UA clear clear   Glucose, UA negative negative   Bilirubin, UA negative negative   Ketones, POC UA negative negative   Spec Grav, UA >=1.030    Blood, UA negative negative   pH, UA 6.0    Protein Ur, POC negative negative   Urobilinogen, UA 0.2    Nitrite, UA Negative Negative   Leukocytes, UA Negative Negative  POCT urine pregnancy     Status: None   Collection Time: 09/03/16  3:20 PM  Result Value Ref Range   Preg Test, Ur Negative Negative    Dg Chest 2 View  Result Date: 09/03/2016 CLINICAL DATA:  Left-sided chest pain EXAM: CHEST  2 VIEW COMPARISON:  Chest x-ray of 05/10/2015  FINDINGS: No pneumonia or effusion is seen. Mild peribronchial thickening is present which can be seen with bronchitis. Mediastinal and hilar contours are unremarkable. The heart is within normal limits in size. No bony abnormality is noted. IMPRESSION: No pneumonia or effusion.  Question bronchitis. Electronically Signed   By: Ivar Drape M.D.   On: 09/03/2016 15:33    ASSESSMENT AND PLAN:  Alyssa Bryan was seen today for chest pain.  Diagnoses and all orders for this visit:  Other chest pain: [phone] -     EKG 12-Lead -     Troponin I -     D-dimer, quantitative (not at [facility]) -     POCT CBC -     CMP14+EGFR -     POCT urinalysis dipstick -     POCT urine pregnancy -     DG Chest 2 View; Future  Tenderness of chest wall: Will rule out MI and D-dimer even though she is low risk for both.     The patient is advised to call or return to clinic if she does not see an improvement in symptoms, or to seek the care of the closest emergency department if she worsens with the above plan.   Philis Fendt, MHS, PA-C [facility] 09/03/2016 3:37 PM

## 2016-09-04 LAB — CMP14+EGFR
ALBUMIN: 4.4 g/dL (ref 3.5–5.5)
ALK PHOS: 78 IU/L (ref 39–117)
ALT: 16 IU/L (ref 0–32)
AST: 12 IU/L (ref 0–40)
Albumin/Globulin Ratio: 1.7 (ref 1.2–2.2)
BUN/Creatinine Ratio: 17 (ref 9–23)
BUN: 10 mg/dL (ref 6–20)
Bilirubin Total: 0.3 mg/dL (ref 0.0–1.2)
CO2: 21 mmol/L (ref 18–29)
CREATININE: 0.58 mg/dL (ref 0.57–1.00)
Calcium: 9.1 mg/dL (ref 8.7–10.2)
Chloride: 102 mmol/L (ref 96–106)
GFR calc Af Amer: 147 mL/min/{1.73_m2} (ref >59)
GFR calc non Af Amer: 128 mL/min/{1.73_m2} (ref >59)
GLUCOSE: 96 mg/dL (ref 65–99)
Globulin, Total: 2.6 g/dL (ref 1.5–4.5)
Potassium: 3.9 mmol/L (ref 3.5–5.2)
Sodium: 139 mmol/L (ref 134–144)
Total Protein: 7 g/dL (ref 6.0–8.5)

## 2016-09-04 LAB — TROPONIN I

## 2016-09-04 LAB — D-DIMER, QUANTITATIVE: D-DIMER: 0.24 mg/L FEU (ref 0.00–0.49)

## 2016-09-21 ENCOUNTER — Other Ambulatory Visit: Payer: Self-pay | Admitting: Physician Assistant

## 2016-11-01 ENCOUNTER — Other Ambulatory Visit: Payer: Self-pay | Admitting: Urgent Care

## 2017-11-27 ENCOUNTER — Other Ambulatory Visit: Payer: Self-pay | Admitting: Obstetrics and Gynecology

## 2017-11-27 DIAGNOSIS — N6452 Nipple discharge: Secondary | ICD-10-CM

## 2017-12-03 ENCOUNTER — Ambulatory Visit
Admission: RE | Admit: 2017-12-03 | Discharge: 2017-12-03 | Disposition: A | Discharge status: Home / Self Care | Payer: BLUE CROSS/BLUE SHIELD | Source: Ambulatory Visit | Attending: Obstetrics and Gynecology | Admitting: Obstetrics and Gynecology

## 2017-12-03 DIAGNOSIS — N6452 Nipple discharge: Secondary | ICD-10-CM

## 2018-06-22 ENCOUNTER — Other Ambulatory Visit: Payer: Self-pay | Admitting: Endocrinology

## 2018-06-22 DIAGNOSIS — E041 Nontoxic single thyroid nodule: Secondary | ICD-10-CM

## 2018-07-02 ENCOUNTER — Ambulatory Visit
Admission: RE | Admit: 2018-07-02 | Discharge: 2018-07-02 | Disposition: A | Discharge status: Home / Self Care | Payer: BLUE CROSS/BLUE SHIELD | Source: Ambulatory Visit | Attending: Endocrinology | Admitting: Endocrinology

## 2018-07-02 DIAGNOSIS — E041 Nontoxic single thyroid nodule: Secondary | ICD-10-CM

## 2018-07-31 ENCOUNTER — Telehealth: Payer: Self-pay | Admitting: *Deleted

## 2018-07-31 ENCOUNTER — Ambulatory Visit (INDEPENDENT_AMBULATORY_CARE_PROVIDER_SITE_OTHER): Payer: BLUE CROSS/BLUE SHIELD

## 2018-07-31 ENCOUNTER — Encounter: Payer: Self-pay | Admitting: Physician Assistant

## 2018-07-31 ENCOUNTER — Ambulatory Visit: Payer: BLUE CROSS/BLUE SHIELD | Admitting: Physician Assistant

## 2018-07-31 VITALS — BP 140/80 | HR 87 | Temp 98.8°F | Ht 69.0 in | Wt 202.0 lb

## 2018-07-31 DIAGNOSIS — R1031 Right lower quadrant pain: Secondary | ICD-10-CM

## 2018-07-31 LAB — POCT URINALYSIS DIPSTICK
Bilirubin, UA: NEGATIVE
GLUCOSE UA: NEGATIVE
KETONES UA: NEGATIVE
Nitrite, UA: NEGATIVE
Protein, UA: NEGATIVE
RBC UA: NEGATIVE
SPEC GRAV UA: 1.015 (ref 1.010–1.025)
Urobilinogen, UA: 0.2 E.U./dL
pH, UA: 8.5 — AB (ref 5.0–8.0)

## 2018-07-31 LAB — BASIC METABOLIC PANEL
Creatinine: 1 (ref 0.5–1.1)
GLUCOSE: 99

## 2018-07-31 LAB — CBC AND DIFFERENTIAL
HCT: 40 (ref 36–46)
Hemoglobin: 13.2 (ref 12.0–16.0)
Platelets: 371 (ref 150–399)

## 2018-07-31 LAB — POCT URINE PREGNANCY: Preg Test, Ur: NEGATIVE

## 2018-07-31 LAB — HEPATIC FUNCTION PANEL: Bilirubin, Total: 0.3

## 2018-07-31 NOTE — Progress Notes (Signed)
Alyssa Bryan is a 28 y.o. female here for a new problem.  I acted as a Education administrator for [employer], PA-C Alyssa Bryan, [place]   History of Present Illness:   Chief Complaint  Patient presents with  . Abdominal Pain    Abdominal Pain  This is a new problem. Episode onset: Started on Friday. The onset quality is gradual. The problem occurs intermittently. Duration: 5-10 minutes at a time. The problem has been unchanged. The pain is located in the periumbilical region. The pain is at a severity of 4/10. The quality of the pain is a sensation of fullness (feels like knife is there causing dull pain). The abdominal pain radiates to the RLQ. Associated symptoms include constipation (moved bowels yesterday for the first time since Last Thursday), flatus, headaches and nausea. Pertinent negatives include no dysuria, fever, hematuria or vomiting. The pain is aggravated by bowel movement. The pain is relieved by bowel movements. Treatments tried: Dulcolax took two Wed and Thurs. The treatment provided moderate relief. There is no history of irritable bowel syndrome.    Past Medical History:  Diagnosis Date  . Thyroid disease      Social History   Socioeconomic History  . Marital status: Single    Spouse name: Not on file  . Number of children: Not on file  . Years of education: Not on file  . Highest education level: Not on file  Occupational History  . Not on file  Social Needs  . Financial resource strain: Not on file  . Food insecurity:    Worry: Not on file    Inability: Not on file  . Transportation needs:    Medical: Not on file    Non-medical: Not on file  Tobacco Use  . Smoking status: Never Smoker  . Smokeless tobacco: Never Used  Substance and Sexual Activity  . Alcohol use: Yes    Alcohol/week: 1.0 standard drinks    Types: 1 Glasses of wine per week  . Drug use: No  . Sexual activity: Yes    Birth control/protection: None  Lifestyle  . Physical activity:     Days per week: Not on file    Minutes per session: Not on file  . Stress: Not on file  Relationships  . Social connections:    Talks on phone: Not on file    Gets together: Not on file    Attends religious service: Not on file    Active member of club or organization: Not on file    Attends meetings of clubs or organizations: Not on file    Relationship status: Not on file  . Intimate partner violence:    Fear of current or ex partner: Not on file    Emotionally abused: Not on file    Physically abused: Not on file    Forced sexual activity: Not on file  Other Topics Concern  . Not on file  Social History Narrative  . Not on file    Past Surgical History:  Procedure Laterality Date  . HERNIA REPAIR      Family History  Problem Relation Age of Onset  . Mental retardation Maternal Grandmother     No Known Allergies  Current Medications:   Current Outpatient Medications:  .  levothyroxine (SYNTHROID, LEVOTHROID) 112 MCG tablet, , Disp: , Rfl:  .  phentermine 30 MG capsule, TK 1 C PO ONCE D, Disp: , Rfl:    Review of Systems:   Review of Systems  Constitutional: Negative for fever.  Gastrointestinal: Positive for abdominal pain, constipation (moved bowels yesterday for the first time since Last Thursday), flatus and nausea. Negative for vomiting.  Genitourinary: Negative for dysuria and hematuria.  Neurological: Positive for headaches.    Vitals:   Vitals:   07/31/18 1625  BP: 140/80  Pulse: 87  Temp: 98.8 F (37.1 C)  TempSrc: Oral  SpO2: 98%  Weight: 202 lb (91.6 kg)  Height: 5\' 9"  (1.753 m)     Body mass index is 29.83 kg/m.  Physical Exam:   Physical Exam Vitals signs and nursing note reviewed.  Constitutional:      General: She is not in acute distress.    Appearance: She is well-developed. She is not ill-appearing or toxic-appearing.  Cardiovascular:     Rate and Rhythm: Normal rate and regular rhythm.     Pulses: Normal pulses.     Heart  sounds: Normal heart sounds, S1 normal and S2 normal.     Comments: No LE edema Pulmonary:     Effort: Pulmonary effort is normal.     Breath sounds: Normal breath sounds.  Abdominal:     General: Abdomen is flat. Bowel sounds are normal.     Palpations: Abdomen is soft.     Tenderness: There is abdominal tenderness in the periumbilical area. There is no right CVA tenderness or left CVA tenderness. Negative signs include McBurney's sign.  Skin:    General: Skin is warm and dry.  Neurological:     Mental Status: She is alert.     GCS: GCS eye subscore is 4. GCS verbal subscore is 5. GCS motor subscore is 6.  Psychiatric:        Speech: Speech normal.        Behavior: Behavior normal. Behavior is cooperative.     Results for orders placed or performed in visit on 07/31/18  CBC with Differential/Platelet  Result Value Ref Range   WBC CANCELED   POCT urinalysis dipstick  Result Value Ref Range   Color, UA Yellow    Clarity, UA Clear    Glucose, UA Negative Negative   Bilirubin, UA Negative    Ketones, UA Negative    Spec Grav, UA 1.015 1.010 - 1.025   Blood, UA Negative    pH, UA 8.5 (A) 5.0 - 8.0   Protein, UA Negative Negative   Urobilinogen, UA 0.2 0.2 or 1.0 E.U./dL   Nitrite, UA Negative    Leukocytes, UA Trace (A) Negative   Appearance     Odor    POCT urine pregnancy  Result Value Ref Range   Preg Test, Ur Negative Negative    Assessment and Plan:   [name] was seen today for abdominal pain.  Diagnoses and all orders for this visit:  RLQ abdominal pain No red flags on exam. Urine culture pending -- will start antibiotics if if indicated. Labs pending. Given history and physical exam findings, suspect patient with constipation. She does have hx of ovarian cyst, and I did discuss that testing today would not rule that out. Discussed starting Miralax regimen.  Did provide ER precautions should symptoms worsen or change over the weekend. If no improvement in symptoms,  advised patient to follow-up with [place] next week.  . Reviewed expectations re: course of current medical issues. . Discussed self-management of symptoms. . Outlined signs and symptoms indicating need for more acute intervention. . Patient verbalized understanding and all questions were answered. . See orders for this visit  as documented in the electronic medical record. . Patient received an After-Visit Summary.  CMA or LPN served as scribe during this visit. History, Physical, and Plan performed by medical provider. The above documentation has been reviewed and is accurate and complete.  Alyssa Coke, PA-C

## 2018-07-31 NOTE — Telephone Encounter (Signed)
Spoke to pt told her I saw she scheduled an appt on 2/3 for stomach pains. Asked pt when pain started? Pt said last Friday has been intense at times, pain located mid abdomen to RLQ. Asked pt why she made appt so far out? Pt said she works in [employer] and needs early morning or end of day. Told pt I have a 4:00PM available today if you can come in to be evaluated. Pt said yes. Told her okay we will see you then. Pt verbalized understanding.

## 2018-07-31 NOTE — Patient Instructions (Addendum)
It was great to see you!  Please start Miralax -- 1 capful daily until goal of soft, formed bowel movement daily. May increase by 1/2 capful every three days as needed.  We will be in touch with your lab results.  Contact a doctor if:  Your belly pain changes or gets worse.  You are not hungry, or you lose weight without trying.  You are having trouble pooping (constipated) or have watery poop (diarrhea) for more than 2-3 days.  You have pain when you pee or poop.  Your belly pain wakes you up at night.  Your pain gets worse with meals, after eating, or with certain foods.  You are throwing up and cannot keep anything down.  You have a fever. Get help right away if:  Your pain does not go away as soon as your doctor says it should.  You cannot stop throwing up.  Your pain is only in areas of your belly, such as the right side or the left lower part of the belly.  You have bloody or black poop, or poop that looks like tar.  You have very bad pain, cramping, or bloating in your belly.  You have signs of not having enough fluid or water in your body (dehydration), such as: ? Dark pee, very little pee, or no pee. ? Cracked lips. ? Dry mouth. ? Sunken eyes. ? Sleepiness. ? Weakness. This information is not intended to replace advice given to you by your health care provider. Make sure you discuss any questions you have with your health care provider.

## 2018-08-01 ENCOUNTER — Encounter: Payer: Self-pay | Admitting: Physician Assistant

## 2018-08-03 LAB — CBC WITH DIFFERENTIAL/PLATELET

## 2018-08-03 LAB — URINE CULTURE
MICRO NUMBER:: 101628
Result:: NO GROWTH
SPECIMEN QUALITY:: ADEQUATE

## 2018-08-03 LAB — COMPREHENSIVE METABOLIC PANEL

## 2018-08-05 LAB — SPECIMEN STATUS REPORT

## 2018-08-06 LAB — CBC WITH DIFFERENTIAL/PLATELET
Basophils Absolute: 0 10*3/uL (ref 0.0–0.2)
Basos: 0 %
EOS (ABSOLUTE): 0.2 10*3/uL (ref 0.0–0.4)
Eos: 2 %
Hematocrit: 39.7 % (ref 34.0–46.6)
Hemoglobin: 13.2 g/dL (ref 11.1–15.9)
Immature Grans (Abs): 0 10*3/uL (ref 0.0–0.1)
Immature Granulocytes: 0 %
LYMPHS: 30 %
Lymphocytes Absolute: 2.8 10*3/uL (ref 0.7–3.1)
MCH: 30.6 pg (ref 26.6–33.0)
MCHC: 33.2 g/dL (ref 31.5–35.7)
MCV: 92 fL (ref 79–97)
MONOCYTES: 4 %
Monocytes Absolute: 0.3 10*3/uL (ref 0.1–0.9)
Neutrophils Absolute: 6.1 10*3/uL (ref 1.4–7.0)
Neutrophils: 64 %
PLATELETS: 371 10*3/uL (ref 150–450)
RBC: 4.31 x10E6/uL (ref 3.77–5.28)
RDW: 12.3 % (ref 11.7–15.4)
WBC: 9.5 10*3/uL (ref 3.4–10.8)

## 2018-08-06 LAB — COMPREHENSIVE METABOLIC PANEL
ALT: 44 IU/L — ABNORMAL HIGH (ref 0–32)
AST: 22 IU/L (ref 0–40)
Albumin/Globulin Ratio: 1.8 (ref 1.2–2.2)
Albumin: 4.4 g/dL (ref 3.9–5.0)
Alkaline Phosphatase: 77 IU/L (ref 39–117)
BUN/Creatinine Ratio: 9 (ref 9–23)
BUN: 9 mg/dL (ref 6–20)
Bilirubin Total: 0.3 mg/dL (ref 0.0–1.2)
CALCIUM: 9.5 mg/dL (ref 8.7–10.2)
CO2: 21 mmol/L (ref 20–29)
Chloride: 103 mmol/L (ref 96–106)
Creatinine, Ser: 1.01 mg/dL — ABNORMAL HIGH (ref 0.57–1.00)
GFR calc Af Amer: 88 mL/min/{1.73_m2} (ref >59)
GFR calc non Af Amer: 76 mL/min/{1.73_m2} (ref >59)
Globulin, Total: 2.4 g/dL (ref 1.5–4.5)
Glucose: 99 mg/dL (ref 65–99)
Potassium: 4.3 mmol/L (ref 3.5–5.2)
Sodium: 139 mmol/L (ref 134–144)
Total Protein: 6.8 g/dL (ref 6.0–8.5)

## 2018-08-06 LAB — H. PYLORI ANTIBODY, IGG: H PYLORI IGG: 0.29 {index_val} (ref 0.00–0.79)

## 2018-08-06 LAB — URINE CULTURE

## 2018-08-06 LAB — LIPASE: Lipase: 28 U/L (ref 14–72)

## 2018-08-10 ENCOUNTER — Ambulatory Visit: Payer: BLUE CROSS/BLUE SHIELD | Admitting: Physician Assistant

## 2018-08-14 ENCOUNTER — Encounter: Payer: Self-pay | Admitting: Family Medicine

## 2018-09-09 ENCOUNTER — Ambulatory Visit: Payer: BLUE CROSS/BLUE SHIELD | Admitting: Physician Assistant

## 2018-09-09 ENCOUNTER — Encounter: Payer: Self-pay | Admitting: Physician Assistant

## 2018-09-09 VITALS — BP 102/80 | HR 73 | Temp 98.4°F | Ht 69.0 in | Wt 202.5 lb

## 2018-09-09 DIAGNOSIS — E039 Hypothyroidism, unspecified: Secondary | ICD-10-CM | POA: Diagnosis not present

## 2018-09-09 NOTE — Progress Notes (Signed)
Alyssa Bryan is a 28 y.o. female here for a new problem.  History of Present Illness:   Chief Complaint  Patient presents with  . Establish Care    HPI   Hypothyroidism -- has had since age 36. Current managed by Dr. Chalmers Cater. Thyroid nodule found on u/s 2019, 65mm with no concerns. She was most recently decreased from 125 mcg to 112 mcg.  She denies any family history of thyroid cancers.  When she was last here she was having some abdominal pain.  She states that she feels much better and that this is essentially resolved.  She has an OB/GYN.  She states that her last physical was sometime last May 2019.  She is not due for a physical at this time.     Past Medical History:  Diagnosis Date  . Thyroid disease      Social History   Socioeconomic History  . Marital status: Single    Spouse name: Not on file  . Number of children: Not on file  . Years of education: Not on file  . Highest education level: Not on file  Occupational History  . Not on file  Social Needs  . Financial resource strain: Not on file  . Food insecurity:    Worry: Not on file    Inability: Not on file  . Transportation needs:    Medical: Not on file    Non-medical: Not on file  Tobacco Use  . Smoking status: Never Smoker  . Smokeless tobacco: Never Used  Substance and Sexual Activity  . Alcohol use: Yes    Alcohol/week: 1.0 standard drinks    Types: 1 Glasses of wine per week  . Drug use: No  . Sexual activity: Yes    Birth control/protection: None  Lifestyle  . Physical activity:    Days per week: Not on file    Minutes per session: Not on file  . Stress: Not on file  Relationships  . Social connections:    Talks on phone: Not on file    Gets together: Not on file    Attends religious service: Not on file    Active member of club or organization: Not on file    Attends meetings of clubs or organizations: Not on file    Relationship status: Not on file  . Intimate partner  violence:    Fear of current or ex partner: Not on file    Emotionally abused: Not on file    Physically abused: Not on file    Forced sexual activity: Not on file  Other Topics Concern  . Not on file  Social History Narrative   [employer]    Past Surgical History:  Procedure Laterality Date  . HERNIA REPAIR  1997    Family History  Problem Relation Age of Onset  . Mental retardation Maternal Grandmother   . Lung cancer Paternal Grandfather   . Colon cancer Neg Hx   . Breast cancer Neg Hx     No Known Allergies  Current Medications:   Current Outpatient Medications:  .  levothyroxine (SYNTHROID, LEVOTHROID) 112 MCG tablet, , Disp: , Rfl:    Review of Systems:   Review of Systems  Constitutional: Negative for chills, fever, malaise/fatigue and weight loss.  Respiratory: Negative for shortness of breath.   Cardiovascular: Negative for chest pain, orthopnea, claudication and leg swelling.  Gastrointestinal: Negative for heartburn, nausea and vomiting.  Neurological: Negative for dizziness, tingling and headaches.  Vitals:   Vitals:   09/09/18 0817  BP: 102/80  Pulse: 73  Temp: 98.4 F (36.9 C)  TempSrc: Oral  SpO2: 97%  Weight: 202 lb 8 oz (91.9 kg)  Height: 5\' 9"  (1.753 m)     Body mass index is 29.9 kg/m.  Physical Exam:   Physical Exam Vitals signs and nursing note reviewed.  Constitutional:      General: She is not in acute distress.    Appearance: She is well-developed. She is not ill-appearing or toxic-appearing.  Cardiovascular:     Rate and Rhythm: Normal rate and regular rhythm.     Pulses: Normal pulses.     Heart sounds: Normal heart sounds, S1 normal and S2 normal.     Comments: No LE edema Pulmonary:     Effort: Pulmonary effort is normal.     Breath sounds: Normal breath sounds.  Skin:    General: Skin is warm and dry.  Neurological:     Mental Status: She is alert.     GCS: GCS eye subscore is 4. GCS verbal subscore is  5. GCS motor subscore is 6.  Psychiatric:        Speech: Speech normal.        Behavior: Behavior normal. Behavior is cooperative.      Assessment and Plan:   Alyssa Bryan was seen today for establish care.  Diagnoses and all orders for this visit:  Hypothyroidism, unspecified type   Management per Dr. Chalmers Cater.  Follow-up as needed for physical.  . Reviewed expectations re: course of current medical issues. . Discussed self-management of symptoms. . Outlined signs and symptoms indicating need for more acute intervention. . Patient verbalized understanding and all questions were answered. . See orders for this visit as documented in the electronic medical record. . Patient received an After-Visit Summary.  Inda Coke, PA-C

## 2018-09-09 NOTE — Patient Instructions (Signed)
It was great to see you!  Follow-up after May for your physical.  Take care,  Inda Coke PA-C

## 2018-09-11 ENCOUNTER — Encounter: Payer: Self-pay | Admitting: Physician Assistant

## 2019-01-06 ENCOUNTER — Telehealth: Payer: Self-pay | Admitting: Physician Assistant

## 2019-01-06 ENCOUNTER — Encounter: Payer: Self-pay | Admitting: Physician Assistant

## 2019-01-06 ENCOUNTER — Other Ambulatory Visit: Payer: BLUE CROSS/BLUE SHIELD

## 2019-01-06 ENCOUNTER — Ambulatory Visit: Payer: Self-pay | Admitting: *Deleted

## 2019-01-06 ENCOUNTER — Ambulatory Visit (INDEPENDENT_AMBULATORY_CARE_PROVIDER_SITE_OTHER): Payer: BLUE CROSS/BLUE SHIELD | Admitting: Physician Assistant

## 2019-01-06 VITALS — Temp 98.6°F | Ht 69.0 in | Wt 206.0 lb

## 2019-01-06 DIAGNOSIS — Z7189 Other specified counseling: Secondary | ICD-10-CM | POA: Diagnosis not present

## 2019-01-06 DIAGNOSIS — R6889 Other general symptoms and signs: Secondary | ICD-10-CM

## 2019-01-06 DIAGNOSIS — Z20822 Contact with and (suspected) exposure to covid-19: Secondary | ICD-10-CM

## 2019-01-06 NOTE — Telephone Encounter (Signed)
Scheduled patient for appointment today at 2:30 pm at [facility].  Testing protocol reviewed.

## 2019-01-06 NOTE — Telephone Encounter (Signed)
Please call and schedule COVD-19 testing.  Inda Coke PA-C

## 2019-01-06 NOTE — Telephone Encounter (Signed)
Pt called with complaints of body aches and chills; her temp was 101.0 on 01/05/19 at 2100; it is down to 99.8 at 0730;she also complains of headache rated 5 out of 10; she would like a COVID test; recommendations made per nurse triage protocol; she verbalized understanding; the pt sees Inda Coke, [place]; pt transferred to [facility] for scheduling.  Reason for Disposition . HIGH RISK patient (e.g., age > 73 years, diabetes, heart or lung disease, weak immune system)  Answer Assessment - Initial Assessment Questions 1. COVID-19 DIAGNOSIS: "Who made your Coronavirus (COVID-19) diagnosis?" "Was it confirmed by a positive lab test?" If not diagnosed by a HCP, ask "Are there lots of cases (community spread) where you live?" (See public health department website, if unsure)     [place], [place] major community spread  2. ONSET: "When did the COVID-19 symptoms start?"     01/05/2019 3. WORST SYMPTOM: "What is your worst symptom?" (e.g., cough, fever, shortness of breath, muscle aches)     Body aches 4. COUGH: "Do you have a cough?" If so, ask: "How bad is the cough?"      no 5. FEVER: "Do you have a fever?" If so, ask: "What is your temperature, how was it measured, and when did it start?"     101.0 on 01/05/19 6. RESPIRATORY STATUS: "Describe your breathing?" (e.g., shortness of breath, wheezing, unable to speak)      Denies SOB or difficulty breathing 7. BETTER-SAME-WORSE: "Are you getting better, staying the same or getting worse compared to yesterday?"  If getting worse, ask, "In what way?"    Worse, achy 8. HIGH RISK DISEASE: "Do you have any chronic medical problems?" (e.g., asthma, heart or lung disease, weak immune system, etc.)     hypothyroid 9. PREGNANCY: "Is there any chance you are pregnant?" "When was your last menstrual period?"     No, LMP 12/25/2018 10. OTHER SYMPTOMS: "Do you have any other symptoms?"  (e.g., chills, fatigue, headache, loss of smell or taste, muscle pain,  sore throat)       Fatigue, chills, body aches, headache  Protocols used: CORONAVIRUS (COVID-19) DIAGNOSED OR SUSPECTED-A-AH

## 2019-01-06 NOTE — Addendum Note (Signed)
Addended by: Denyce Robert on: 01/06/2019 09:48 AM   Modules accepted: Orders

## 2019-01-06 NOTE — Telephone Encounter (Signed)
Patient seen for appt 01/06/19

## 2019-01-06 NOTE — Progress Notes (Signed)
Virtual Visit via Video   I connected with Alyssa Bryan on 01/06/19 at  8:40 AM EDT by a video enabled telemedicine application and verified that I am speaking with the correct person using two identifiers. Location patient: Home Location provider: [facility], Office Persons participating in the virtual visit: Elois Bryan, Inda Coke PA-C  I discussed the limitations of evaluation and management by telemedicine and the availability of in person appointments. The patient expressed understanding and agreed to proceed.  Subjective:   HPI:   Started yesterday. First symptoms was fatigue. Started chills last night, fever of 101. Took Tylenol and fever came down to 98.6. Had a little diarrhea yesterday. Eating and drinking well. Now having body aches. Denies any confirmed exposure.  Denies: chest pain, SOB, cough, chills, sore throat  ROS: See pertinent positives and negatives per HPI.  Patient Active Problem List   Diagnosis Date Noted  . Hypothyroidism 05/20/2013  . Overweight (BMI 25.0-29.9) 05/20/2013    Social History   Tobacco Use  . Smoking status: Never Smoker  . Smokeless tobacco: Never Used  Substance Use Topics  . Alcohol use: Yes    Alcohol/week: 1.0 standard drinks    Types: 1 Glasses of wine per week    Current Outpatient Medications:  .  levothyroxine (SYNTHROID) 137 MCG tablet, Take 137 mcg by mouth daily before breakfast., Disp: , Rfl:   No Known Allergies  Objective:   VITALS: Per patient if applicable, see vitals. GENERAL: Alert, appears well and in no acute distress. HEENT: Atraumatic, conjunctiva clear, no obvious abnormalities on inspection of external nose and ears. NECK: Normal movements of the head and neck. CARDIOPULMONARY: No increased WOB. Speaking in clear sentences. I:E ratio WNL.  MS: Moves all visible extremities without noticeable abnormality. PSYCH: Pleasant and cooperative, well-groomed. Speech normal rate and rhythm.  Affect is appropriate. Insight and judgement are appropriate. Attention is focused, linear, and appropriate.  NEURO: CN grossly intact. Oriented as arrived to appointment on time with no prompting. Moves both UE equally.  SKIN: No obvious lesions, wounds, erythema, or cyanosis noted on face or hands.  Assessment and Plan:   Kaydense was seen today for fever, generalized body aches and headache.  Diagnoses and all orders for this visit:  Suspected Covid-19 Virus Infection -     Temperature monitoring; Future  Advice Given About Covid-19 Virus Infection  Other orders -     MyChart COVID-19 home monitoring program; Future   Patient has a respiratory illness without signs of acute distress or respiratory compromise at this time. This is likely a viral infection, which can come from a number of respiratory viruses. At this time, I have recommended drive-up testing at outpatient [facility] site for COVID-testing. As a precaution, they have been advised to remain home for 2 weeks from symptom onset or 48 hours after resolution of fever, whichever is longer. Advised if they experience a "second sickening" or worsening symptoms as the illness progresses, they are to call the office for further instructions or seek emergent evaluation for any severe symptoms.   . Reviewed expectations re: course of current medical issues. . Discussed self-management of symptoms. . Outlined signs and symptoms indicating need for more acute intervention. . Patient verbalized understanding and all questions were answered. Marland Kitchen Health Maintenance issues including appropriate healthy diet, exercise, and smoking avoidance were discussed with patient. . See orders for this visit as documented in the electronic medical record.  I discussed the assessment and treatment plan with  the patient. The patient was provided an opportunity to ask questions and all were answered. The patient agreed with the plan and demonstrated an understanding  of the instructions.   The patient was advised to call back or seek an in-person evaluation if the symptoms worsen or if the condition fails to improve as anticipated.   CMA or LPN served as scribe during this visit. History, Physical, and Plan performed by medical provider. The above documentation has been reviewed and is accurate and complete.   [place], [place] 01/06/2019

## 2019-01-10 LAB — NOVEL CORONAVIRUS, NAA: SARS-CoV-2, NAA: DETECTED — AB

## 2019-01-11 ENCOUNTER — Encounter: Payer: Self-pay | Admitting: Physician Assistant

## 2019-01-11 DIAGNOSIS — U071 COVID-19: Secondary | ICD-10-CM | POA: Insufficient documentation

## 2019-07-09 HISTORY — PX: UPPER GI ENDOSCOPY: SHX6162

## 2019-07-09 HISTORY — PX: OTHER SURGICAL HISTORY: SHX169

## 2019-10-06 ENCOUNTER — Ambulatory Visit (INDEPENDENT_AMBULATORY_CARE_PROVIDER_SITE_OTHER): Payer: BLUE CROSS/BLUE SHIELD | Admitting: Physician Assistant

## 2019-10-06 ENCOUNTER — Encounter: Payer: Self-pay | Admitting: Physician Assistant

## 2019-10-06 ENCOUNTER — Other Ambulatory Visit: Payer: Self-pay

## 2019-10-06 VITALS — BP 120/80 | HR 67 | Temp 97.8°F | Ht 69.0 in | Wt 224.0 lb

## 2019-10-06 DIAGNOSIS — Z0001 Encounter for general adult medical examination with abnormal findings: Secondary | ICD-10-CM

## 2019-10-06 DIAGNOSIS — Z13220 Encounter for screening for lipoid disorders: Secondary | ICD-10-CM

## 2019-10-06 DIAGNOSIS — Z136 Encounter for screening for cardiovascular disorders: Secondary | ICD-10-CM

## 2019-10-06 DIAGNOSIS — E039 Hypothyroidism, unspecified: Secondary | ICD-10-CM | POA: Diagnosis not present

## 2019-10-06 DIAGNOSIS — K625 Hemorrhage of anus and rectum: Secondary | ICD-10-CM | POA: Diagnosis not present

## 2019-10-06 DIAGNOSIS — E669 Obesity, unspecified: Secondary | ICD-10-CM | POA: Diagnosis not present

## 2019-10-06 DIAGNOSIS — R1033 Periumbilical pain: Secondary | ICD-10-CM | POA: Diagnosis not present

## 2019-10-06 LAB — CBC WITH DIFFERENTIAL/PLATELET
Basophils Absolute: 0 10*3/uL (ref 0.0–0.1)
Basophils Relative: 0.6 % (ref 0.0–3.0)
Eosinophils Absolute: 0.2 10*3/uL (ref 0.0–0.7)
Eosinophils Relative: 2.5 % (ref 0.0–5.0)
HCT: 40.6 % (ref 36.0–46.0)
Hemoglobin: 13.9 g/dL (ref 12.0–15.0)
Lymphocytes Relative: 37.7 % (ref 12.0–46.0)
Lymphs Abs: 2.7 10*3/uL (ref 0.7–4.0)
MCHC: 34.2 g/dL (ref 30.0–36.0)
MCV: 89.3 fl (ref 78.0–100.0)
Monocytes Absolute: 0.6 10*3/uL (ref 0.1–1.0)
Monocytes Relative: 8.4 % (ref 3.0–12.0)
Neutro Abs: 3.7 10*3/uL (ref 1.4–7.7)
Neutrophils Relative %: 50.8 % (ref 43.0–77.0)
Platelets: 331 10*3/uL (ref 150.0–400.0)
RBC: 4.54 Mil/uL (ref 3.87–5.11)
RDW: 12.6 % (ref 11.5–15.5)
WBC: 7.2 10*3/uL (ref 4.0–10.5)

## 2019-10-06 LAB — LIPID PANEL
Cholesterol: 216 mg/dL — ABNORMAL HIGH (ref 0–200)
HDL: 49.5 mg/dL (ref >39.00)
LDL Cholesterol: 129 mg/dL — ABNORMAL HIGH (ref 0–99)
NonHDL: 166.78
Total CHOL/HDL Ratio: 4
Triglycerides: 190 mg/dL — ABNORMAL HIGH (ref 0.0–149.0)
VLDL: 38 mg/dL (ref 0.0–40.0)

## 2019-10-06 LAB — COMPREHENSIVE METABOLIC PANEL
ALT: 27 U/L (ref 0–35)
AST: 21 U/L (ref 0–37)
Albumin: 4.4 g/dL (ref 3.5–5.2)
Alkaline Phosphatase: 77 U/L (ref 39–117)
BUN: 9 mg/dL (ref 6–23)
CO2: 29 mEq/L (ref 19–32)
Calcium: 9.6 mg/dL (ref 8.4–10.5)
Chloride: 103 mEq/L (ref 96–112)
Creatinine, Ser: 0.67 mg/dL (ref 0.40–1.20)
GFR: 103.8 mL/min (ref >60.00)
Glucose, Bld: 91 mg/dL (ref 70–99)
Potassium: 4.1 mEq/L (ref 3.5–5.1)
Sodium: 138 mEq/L (ref 135–145)
Total Bilirubin: 0.6 mg/dL (ref 0.2–1.2)
Total Protein: 7 g/dL (ref 6.0–8.3)

## 2019-10-06 NOTE — Progress Notes (Signed)
Subjective:    Alyssa Bryan is a 29 y.o. female and is here for a comprehensive physical exam.  HPI  Health Maintenance Due  Topic Date Due  . HIV Screening  Never done  . PAP-Cervical Cytology Screening  Never done  . PAP SMEAR-Modifier  Never done    Acute Concerns: Rectal bleeding - Pt c/o bright red rectal bleeding on 3 occasions over the past 6 months. Each time was after a normal bowel movement, denies hx of constipation. She reports that the toilet bowel was full of blood after each episode, denies clots. Pt c/o bloating in lower abdomen, chronic abdominal pain to the R of her umbilicus. Gave up meat for lent and didn't find much of a difference. Denies anal sex, unintentional weight loss, n/v, diarrhea, family hx of colon cancer, family hx of IBD, lightheadedness, dizziness.  Chronic Issues: Hypothyroidism -- managed by [facility] Chalmers Cater at [facility], currently on 137 mcg. Feels well controlled.  Health Maintenance: Immunizations -- UTD Colonoscopy -- n/a Mammogram -- n/a PAP -- sees ob-gyn, requesting records Diet -- has had weight gain with covid  Sleep habits -- no significant concerns Exercise -- limited Current Weight -- Weight: 224 lb (101.6 kg)  Weight History: Wt Readings from Last 10 Encounters:  10/06/19 224 lb (101.6 kg)  01/06/19 206 lb (93.4 kg)  09/09/18 202 lb 8 oz (91.9 kg)  07/31/18 202 lb (91.6 kg)  09/03/16 224 lb 9.6 oz (101.9 kg)  07/17/16 223 lb (101.2 kg)  05/10/15 224 lb (101.6 kg)  05/03/14 210 lb 9.6 oz (95.5 kg)  05/20/13 205 lb (93 kg)  10/25/12 199 lb 6.4 oz (90.4 kg)   Mood -- stable Patient's last menstrual period was 09/27/2019.   Depression screen PHQ 2/9 09/03/2016  Decreased Interest 0  Down, Depressed, Hopeless 0  PHQ - 2 Score 0     Other providers/specialists: Patient Care Team: Inda Coke, [place] as PCP - General (Physician Assistant) Linda Hedges, DO as Consulting Physician (Obstetrics and Gynecology)   PMHx,  SurgHx, SocialHx, Medications, and Allergies were reviewed in the Visit Navigator and updated as appropriate.   Past Medical History:  Diagnosis Date  . Thyroid disease      Past Surgical History:  Procedure Laterality Date  . HERNIA REPAIR  1997     Family History  Problem Relation Age of Onset  . Mental retardation Maternal Grandmother   . Lung cancer Paternal Grandfather   . Colon cancer Neg Hx   . Breast cancer Neg Hx     Social History   Tobacco Use  . Smoking status: Never Smoker  . Smokeless tobacco: Never Used  Substance Use Topics  . Alcohol use: Yes    Alcohol/week: 1.0 standard drinks    Types: 1 Glasses of wine per week  . Drug use: No    Review of Systems:   Review of Systems  Constitutional: Negative for chills, fever, malaise/fatigue and weight loss.  HENT: Negative for hearing loss, sinus pain and sore throat.   Respiratory: Negative for cough and hemoptysis.   Cardiovascular: Negative for chest pain, palpitations, leg swelling and PND.  Gastrointestinal: Positive for abdominal pain and blood in stool. Negative for constipation, diarrhea, heartburn, nausea and vomiting.  Genitourinary: Negative for dysuria, frequency and urgency.  Musculoskeletal: Negative for back pain, myalgias and neck pain.  Skin: Negative for itching and rash.  Neurological: Negative for dizziness, tingling, seizures and headaches.  Endo/Heme/Allergies: Negative for polydipsia.  Psychiatric/Behavioral:  Negative for depression. The patient is not nervous/anxious.     Objective:   BP 120/80 (BP Location: Left Arm, Patient Position: Sitting, Cuff Size: Normal)   Pulse 67   Temp 97.8 F (36.6 C) (Temporal)   Ht 5\' 9"  (1.753 m)   Wt 224 lb (101.6 kg)   LMP 09/27/2019   SpO2 98%   BMI 33.08 kg/m   General Appearance:    Alert, cooperative, no distress, appears stated age  Head:    Normocephalic, without obvious abnormality, atraumatic  Eyes:    PERRL, conjunctiva/corneas  clear, EOM's intact, fundi    benign, both eyes  Ears:    Normal TM's and external ear canals, both ears  Nose:   Nares normal, septum midline, mucosa normal, no drainage    or sinus tenderness  Throat:   Lips, mucosa, and tongue normal; teeth and gums normal  Neck:   Supple, symmetrical, trachea midline, no adenopathy;    thyroid:  no enlargement/tenderness/nodules; no carotid   bruit or JVD  Back:     Symmetric, no curvature, ROM normal, no CVA tenderness  Lungs:     Clear to auscultation bilaterally, respirations unlabored  Chest Wall:    No tenderness or deformity   Heart:    Regular rate and rhythm, S1 and S2 normal, no murmur, rub   or gallop  Breast Exam:    Deferred  Abdomen:     Soft, non-tender, bowel sounds active all four quadrants,    no masses, no organomegaly  Genitalia:    Deferred  Rectal:    Normal tone no masses or tenderness  Extremities:   Extremities normal, atraumatic, no cyanosis or edema  Pulses:   2+ and symmetric all extremities  Skin:   Skin color, texture, turgor normal, no rashes or lesions  Lymph nodes:   Cervical, supraclavicular, and axillary nodes normal  Neurologic:   CNII-XII intact, normal strength, sensation and reflexes    throughout     Assessment/Plan:   Alyssa Bryan was seen today for rectal bleeding.  Diagnoses and all orders for this visit:  Encounter for general adult medical examination with abnormal findings Today patient counseled on age appropriate routine health concerns for screening and prevention, each reviewed and up to date or declined. Immunizations reviewed and up to date or declined. Labs ordered and reviewed. Risk factors for depression reviewed and negative. Hearing function and visual acuity are intact. ADLs screened and addressed as needed. Functional ability and level of safety reviewed and appropriate. Education, counseling and referrals performed based on assessed risks today. Patient provided with a copy of personalized plan  for preventive services.  Rectal bleeding; Periumbilical abdominal pain Referral to GI. No concern for acute abdomen or other immediate red flags. CBC update today. Worsening precautions advised. -     CBC with Differential/Platelet -     Comprehensive metabolic panel -     Ambulatory referral to Gastroenterology  Encounter for lipid screening for cardiovascular disease -     Lipid panel  Hypothyroidism Well managed per patient, compliant with seeing endocrinology.  Obesity, unspecified classification, unspecified obesity type, unspecified whether serious comorbidity present Encouraged physical activity and healthier eating as lifestyle allows.   Well Adult Exam: Labs ordered: Yes. Patient counseling was done. See below for items discussed. Discussed the patient's BMI. The BMI is not in the acceptable range; BMI management plan is completed Follow up in one year.  Patient Counseling:   [x]     Nutrition: Stressed importance  of moderation in sodium/caffeine intake, saturated fat and cholesterol, caloric balance, sufficient intake of fresh fruits, vegetables, fiber, calcium, iron, and 1 mg of folate supplement per day (for females capable of pregnancy).   [x]      Stressed the importance of regular exercise.    [x]     Substance Abuse: Discussed cessation/primary prevention of tobacco, alcohol, or other drug use; driving or other dangerous activities under the influence; availability of treatment for abuse.    [x]      Injury prevention: Discussed safety belts, safety helmets, smoke detector, smoking near bedding or upholstery.    [x]      Sexuality: Discussed sexually transmitted diseases, partner selection, use of condoms, avoidance of unintended pregnancy  and contraceptive alternatives.    [x]     Dental health: Discussed importance of regular tooth brushing, flossing, and dental visits.   [x]      Health maintenance and immunizations reviewed. Please refer to Health maintenance  section.   CMA or LPN served as scribe during this visit. History, Physical, and Plan performed by medical provider. The above documentation has been reviewed and is accurate and complete.   Inda Coke, PA-C [place]

## 2019-10-06 NOTE — Patient Instructions (Signed)
It was great to see you!  You will be contacted about your referral to GI.  I will be in touch via MyChart regarding your labs.   If any severe abdominal pain or bleeding in the meantime, please seek medical attention.  Take care,  Inda Coke PA-C

## 2019-10-21 ENCOUNTER — Encounter: Payer: Self-pay | Admitting: Nurse Practitioner

## 2019-11-03 ENCOUNTER — Ambulatory Visit: Payer: BLUE CROSS/BLUE SHIELD | Admitting: Nurse Practitioner

## 2019-11-04 ENCOUNTER — Other Ambulatory Visit: Payer: Self-pay | Admitting: Endocrinology

## 2019-11-04 DIAGNOSIS — E041 Nontoxic single thyroid nodule: Secondary | ICD-10-CM

## 2019-11-12 ENCOUNTER — Other Ambulatory Visit: Payer: Self-pay

## 2019-11-12 ENCOUNTER — Ambulatory Visit: Payer: BLUE CROSS/BLUE SHIELD | Admitting: Gastroenterology

## 2019-11-12 ENCOUNTER — Encounter: Payer: Self-pay | Admitting: Gastroenterology

## 2019-11-12 VITALS — BP 116/68 | HR 76 | Temp 97.9°F | Ht 69.0 in | Wt 226.4 lb

## 2019-11-12 DIAGNOSIS — R1031 Right lower quadrant pain: Secondary | ICD-10-CM | POA: Diagnosis not present

## 2019-11-12 DIAGNOSIS — K625 Hemorrhage of anus and rectum: Secondary | ICD-10-CM

## 2019-11-12 DIAGNOSIS — R1033 Periumbilical pain: Secondary | ICD-10-CM | POA: Diagnosis not present

## 2019-11-12 MED ORDER — SUPREP BOWEL PREP KIT 17.5-3.13-1.6 GM/177ML PO SOLN: 1.0000 | ORAL | 0 refills | Status: DC

## 2019-11-12 NOTE — Progress Notes (Addendum)
11/12/2019 Alyssa Bryan [id number] 1991-01-13   HISTORY OF PRESENT ILLNESS: This is a pleasant 29 year old female who is new to our office.  She has been referred here by her PCP, Inda Coke, PA-C, for evaluation of rectal bleeding and periumbilical abdominal pain.  She tells me that about 7 months ago she had an episode of bright red blood in the toilet bowl.  That a couple months later it happened again and then a few months ago it happened a third time.  She thought it was probably just hemorrhoidal bleeding or something, but after it occurred the third time she just became more concerned.  She has not had any bleeding for the past 3 or 4 months.  She does also report a lot of generalized abdominal bloating that occurs throughout the day.  She says that she moves her bowels typically about 3 times a day and usually 1 of those bowel movements is softer, but there has been no change in her bowel habits and there is no extreme constipation or diarrhea.  She also reports an intermittent abdominal pain in the right mid abdomen just to the right of her umbilicus.  She says that its always in the same spot.  It comes and goes.  There are times that she does not have any pain at all.  She says it is really just kind of a dull ache, but has been present on and off for about 1 to 1.5 years.  No family history of inflammatory bowel disease or other GI illness to her knowledge.  Recent CBC and CMP within normal limits.   Past Medical History:  Diagnosis Date  . Thyroid disease    Past Surgical History:  Procedure Laterality Date  . HERNIA REPAIR  1997    reports that she has never smoked. She has never used smokeless tobacco. She reports current alcohol use of about 1.0 standard drinks of alcohol per week. She reports that she does not use drugs. family history includes Lung cancer in her paternal grandfather; Mental retardation in her maternal grandmother. No Known Allergies      Outpatient Encounter Medications as of 11/12/2019  Medication Sig  . levothyroxine (SYNTHROID) 137 MCG tablet Take 137 mcg by mouth daily before breakfast.   No facility-administered encounter medications on file as of 11/12/2019.    REVIEW OF SYSTEMS  : All other systems reviewed and negative except where noted in the History of Present Illness.   PHYSICAL EXAM: BP 116/68   Pulse 76   Temp 97.9 F (36.6 C)   Ht 5\' 9"  (1.753 m)   Wt 226 lb 6.4 oz (102.7 kg)   BMI 33.43 kg/m  General: Well developed white female in no acute distress Head: Normocephalic and atraumatic Eyes:  Sclerae anicteric, conjunctiva pink. Ears: Normal auditory acuity Lungs: Clear throughout to auscultation; no increased WOB. Heart: Regular rate and rhythm; no M/R/G. Abdomen: Soft, non-distended.  BS present.  Non-tender. Rectal:  Will be done at the time of colonoscopy. Musculoskeletal: Symmetrical with no gross deformities  Skin: No lesions on visible extremities Extremities: No edema  Neurological: Alert oriented x 4, grossly non-focal Psychological:  Alert and cooperative. Normal mood and affect  ASSESSMENT AND PLAN: *29 year old female with complaints of rectal bleeding x 3 and of intermittent RLQ/right periumbilical abdominal pain: Rectal bleeding could certainly be hemorrhoidal bleeding and pain could be abdominal cramping, but it is always located in the same area when it is present.  I think with both symptoms together it is worth evaluating with colonoscopy and if possible evaluation of the terminal ileum to rule out Crohn's disease and other causes of her symptoms.  We will plan for colonoscopy with Dr. Silverio Decamp.  The risks, benefits, and alternatives to colonoscopy were discussed with the patient and she consents to proceed.  If colonoscopy is negative and pain continues then would suggest cross-sectional imaging such as CT scan.   CC:  Inda Coke, [place]

## 2019-11-12 NOTE — Patient Instructions (Addendum)
If you are age 30 or older, your body mass index should be between 23-30. Your Body mass index is 33.43 kg/m. If this is out of the aforementioned range listed, please consider follow up with your Primary Care Provider.  If you are age 31 or younger, your body mass index should be between 19-25. Your Body mass index is 33.43 kg/m. If this is out of the aformentioned range listed, please consider follow up with your Primary Care Provider.   You have been scheduled for a colonoscopy. Please follow written instructions given to you at your visit today.  Please pick up your prep supplies at the pharmacy within the next 1-3 days.  Due to recent changes in healthcare laws, you may see the results of your imaging and laboratory studies on MyChart before your provider has had a chance to review them.  We understand that in some cases there may be results that are confusing or concerning to you. Not all laboratory results come back in the same time frame and the provider may be waiting for multiple results in order to interpret others.  Please give [place] 48 hours in order for your provider to thoroughly review all the results before contacting the office for clarification of your results.   Thank you for entrusting me with your care and choosing [facility].  Alonza Bogus, PA-C

## 2019-11-12 NOTE — Progress Notes (Signed)
Reviewed and agree with documentation and assessment and plan. K. Veena Terran Klinke , MD   

## 2019-11-17 ENCOUNTER — Encounter: Payer: Self-pay | Admitting: Gastroenterology

## 2019-11-25 ENCOUNTER — Other Ambulatory Visit: Payer: Self-pay

## 2019-11-25 ENCOUNTER — Ambulatory Visit (AMBULATORY_SURGERY_CENTER): Payer: BLUE CROSS/BLUE SHIELD | Admitting: Gastroenterology

## 2019-11-25 ENCOUNTER — Other Ambulatory Visit: Payer: BLUE CROSS/BLUE SHIELD

## 2019-11-25 ENCOUNTER — Encounter: Payer: Self-pay | Admitting: Gastroenterology

## 2019-11-25 VITALS — BP 117/72 | HR 77 | Resp 15 | Ht 69.0 in | Wt 226.0 lb

## 2019-11-25 DIAGNOSIS — K648 Other hemorrhoids: Secondary | ICD-10-CM | POA: Diagnosis not present

## 2019-11-25 DIAGNOSIS — K625 Hemorrhage of anus and rectum: Secondary | ICD-10-CM

## 2019-11-25 DIAGNOSIS — K635 Polyp of colon: Secondary | ICD-10-CM | POA: Diagnosis not present

## 2019-11-25 DIAGNOSIS — K621 Rectal polyp: Secondary | ICD-10-CM | POA: Diagnosis not present

## 2019-11-25 DIAGNOSIS — D122 Benign neoplasm of ascending colon: Secondary | ICD-10-CM

## 2019-11-25 MED ORDER — SODIUM CHLORIDE 0.9 % IV SOLN: 500.0000 mL | Freq: Once | INTRAVENOUS | Status: DC

## 2019-11-25 NOTE — Patient Instructions (Signed)
HANDOUTS PROVIDED ON: POLYPS & DIVERTICULOSIS  The polyps removed/biopsies taken today have been sent for pathology.  The results can take 1-3 weeks to receive.  When your next colonoscopy should occur will be based on the pathology results.    You may resume your previous diet and medication schedule.  Thank you for allowing us to care for you today!!!   YOU HAD AN ENDOSCOPIC PROCEDURE TODAY AT THE  ENDOSCOPY CENTER:   Refer to the procedure report that was given to you for any specific questions about what was found during the examination.  If the procedure report does not answer your questions, please call your gastroenterologist to clarify.  If you requested that your care partner not be given the details of your procedure findings, then the procedure report has been included in a sealed envelope for you to review at your convenience later.  YOU SHOULD EXPECT: Some feelings of bloating in the abdomen. Passage of more gas than usual.  Walking can help get rid of the air that was put into your GI tract during the procedure and reduce the bloating. If you had a lower endoscopy (such as a colonoscopy or flexible sigmoidoscopy) you may notice spotting of blood in your stool or on the toilet paper. If you underwent a bowel prep for your procedure, you may not have a normal bowel movement for a few days.  Please Note:  You might notice some irritation and congestion in your nose or some drainage.  This is from the oxygen used during your procedure.  There is no need for concern and it should clear up in a day or so.  SYMPTOMS TO REPORT IMMEDIATELY:   Following lower endoscopy (colonoscopy or flexible sigmoidoscopy):  Excessive amounts of blood in the stool  Significant tenderness or worsening of abdominal pains  Swelling of the abdomen that is new, acute  Fever of 100F or higher  For urgent or emergent issues, a gastroenterologist can be reached at any hour by calling [phone]. Do  not use MyChart messaging for urgent concerns.    DIET:  We do recommend a small meal at first, but then you may proceed to your regular diet.  Drink plenty of fluids but you should avoid alcoholic beverages for 24 hours.  ACTIVITY:  You should plan to take it easy for the rest of today and you should NOT DRIVE or use heavy machinery until tomorrow (because of the sedation medicines used during the test).    FOLLOW UP: Our staff will call the number listed on your records 48-72 hours following your procedure to check on you and address any questions or concerns that you may have regarding the information given to you following your procedure. If we do not reach you, we will leave a message.  We will attempt to reach you two times.  During this call, we will ask if you have developed any symptoms of COVID 19. If you develop any symptoms (ie: fever, flu-like symptoms, shortness of breath, cough etc.) before then, please call [phone].  If you test positive for Covid 19 in the 2 weeks post procedure, please call and report this information to us.    If any biopsies were taken you will be contacted by phone or by letter within the next 1-3 weeks.  Please call us at [phone] if you have not heard about the biopsies in 3 weeks.    SIGNATURES/CONFIDENTIALITY: You and/or your care partner have signed paperwork which will   be entered into your electronic medical record.  These signatures attest to the fact that that the information above on your After Visit Summary has been reviewed and is understood.  Full responsibility of the confidentiality of this discharge information lies with you and/or your care-partner. 

## 2019-11-25 NOTE — Progress Notes (Signed)
Called to room to assist during endoscopic procedure.  Patient ID and intended procedure confirmed with present staff. Received instructions for my participation in the procedure from the performing physician.  

## 2019-11-25 NOTE — Op Note (Signed)
[facility] Patient Name: Alyssa Bryan Procedure Date: 11/25/2019 1:30 PM MRN: [id number] Endoscopist: Mauri Pole , MD Age: 29 Referring MD:  Date of Birth: Jan 22, 1991 Gender: Female Account #: [id number] Procedure:                Colonoscopy Indications:              Evaluation of unexplained GI bleeding presenting                            with Hematochezia Medicines:                Monitored Anesthesia Care Procedure:                Pre-Anesthesia Assessment:                           - Prior to the procedure, a History and Physical                            was performed, and patient medications and                            allergies were reviewed. The patient's tolerance of                            previous anesthesia was also reviewed. The risks                            and benefits of the procedure and the sedation                            options and risks were discussed with the patient.                            All questions were answered, and informed consent                            was obtained. Prior Anticoagulants: The patient has                            taken no previous anticoagulant or antiplatelet                            agents. ASA Grade Assessment: II - A patient with                            mild systemic disease. After reviewing the risks                            and benefits, the patient was deemed in                            satisfactory condition to undergo the procedure.  After obtaining informed consent, the colonoscope                            was passed under direct vision. Throughout the                            procedure, the patient's blood pressure, pulse, and                            oxygen saturations were monitored continuously. The                            Colonoscope was introduced through the anus and                            advanced to the the terminal ileum,  with                            identification of the appendiceal orifice and IC                            valve. The colonoscopy was performed without                            difficulty. The patient tolerated the procedure                            well. The quality of the bowel preparation was                            excellent. The terminal ileum, ileocecal valve,                            appendiceal orifice, and rectum were photographed. Scope In: 1:36:18 PM Scope Out: 1:51:25 PM Scope Withdrawal Time: 0 hours 11 minutes 9 seconds  Total Procedure Duration: 0 hours 15 minutes 7 seconds  Findings:                 The perianal and digital rectal examinations were                            normal.                           A 7 mm polyp was found in the ascending colon. The                            polyp was sessile. The polyp was removed with a                            cold snare. Resection and retrieval were complete.                           A 2 mm polyp was found in the rectum. The  polyp was                            sessile. The polyp was removed with a cold biopsy                            forceps. Resection and retrieval were complete.                           The terminal ileum appeared normal. Biopsies were                            taken with a cold forceps for histology.                           Normal mucosa was found in the entire colon.                            Biopsies were taken with a cold forceps for                            histology.                           Non-bleeding internal hemorrhoids were found during                            retroflexion. The hemorrhoids were small. Complications:            No immediate complications. Estimated Blood Loss:     Estimated blood loss was minimal. Impression:               - One 7 mm polyp in the ascending colon, removed                            with a cold snare. Resected and retrieved.                            - One 2 mm polyp in the rectum, removed with a cold                            biopsy forceps. Resected and retrieved.                           - The examined portion of the ileum was normal.                            Biopsied.                           - Normal mucosa in the entire examined colon.                            Biopsied.                           -  Non-bleeding internal hemorrhoids likley etiology                            for rectal bleeding Recommendation:           - Patient has a contact number available for                            emergencies. The signs and symptoms of potential                            delayed complications were discussed with the                            patient. Return to normal activities tomorrow.                            Written discharge instructions were provided to the                            patient.                           - Resume previous diet.                           - Continue present medications.                           - Await pathology results.                           - Repeat colonoscopy in 5-10 years for surveillance                            based on pathology results.                           - Return to GI clinic PRN for hemorrhoidal band                            ligation. Mauri Pole, MD 11/25/2019 2:01:40 PM This report has been signed electronically.

## 2019-11-25 NOTE — Progress Notes (Signed)
Report given to PACU, vss 

## 2019-11-29 ENCOUNTER — Telehealth: Payer: Self-pay | Admitting: *Deleted

## 2019-11-29 NOTE — Telephone Encounter (Signed)
First attempt, left VM.  

## 2019-11-29 NOTE — Telephone Encounter (Signed)
Message left

## 2019-12-02 ENCOUNTER — Ambulatory Visit
Admission: RE | Admit: 2019-12-02 | Discharge: 2019-12-02 | Disposition: A | Discharge status: Home / Self Care | Payer: BLUE CROSS/BLUE SHIELD | Source: Ambulatory Visit | Attending: Endocrinology | Admitting: Endocrinology

## 2019-12-02 DIAGNOSIS — E041 Nontoxic single thyroid nodule: Secondary | ICD-10-CM

## 2019-12-09 ENCOUNTER — Encounter: Payer: Self-pay | Admitting: Gastroenterology

## 2020-04-05 ENCOUNTER — Encounter: Payer: Self-pay | Admitting: Gastroenterology

## 2020-04-05 ENCOUNTER — Other Ambulatory Visit (INDEPENDENT_AMBULATORY_CARE_PROVIDER_SITE_OTHER): Payer: BLUE CROSS/BLUE SHIELD

## 2020-04-05 ENCOUNTER — Ambulatory Visit (INDEPENDENT_AMBULATORY_CARE_PROVIDER_SITE_OTHER): Payer: BLUE CROSS/BLUE SHIELD | Admitting: Gastroenterology

## 2020-04-05 VITALS — BP 110/70 | HR 94 | Ht 69.0 in | Wt 232.2 lb

## 2020-04-05 DIAGNOSIS — R1013 Epigastric pain: Secondary | ICD-10-CM | POA: Diagnosis not present

## 2020-04-05 DIAGNOSIS — R140 Abdominal distension (gaseous): Secondary | ICD-10-CM

## 2020-04-05 DIAGNOSIS — R1011 Right upper quadrant pain: Secondary | ICD-10-CM | POA: Diagnosis not present

## 2020-04-05 LAB — IGA: IgA: 189 mg/dL (ref 68–378)

## 2020-04-05 NOTE — Patient Instructions (Signed)
You have been scheduled for an abdominal ultrasound at [facility] (1st floor of hospital) on 10/4 at 8:30am. Please arrive 15 minutes prior to your appointment for registration. Make certain not to have anything to eat or drink 6 hours prior to your appointment. Should you need to reschedule your appointment, please contact radiology at [phone]. This test typically takes about 30 minutes to perform.  You have been scheduled for an endoscopy. Please follow written instructions given to you at your visit today. If you use inhalers (even only as needed), please bring them with you on the day of your procedure.   Your provider has requested that you go to the basement level for lab work before leaving today. Press "B" on the elevator. The lab is located at the first door on the left as you exit the elevator.  Due to recent changes in healthcare laws, you may see the results of your imaging and laboratory studies on MyChart before your provider has had a chance to review them.  We understand that in some cases there may be results that are confusing or concerning to you. Not all laboratory results come back in the same time frame and the provider may be waiting for multiple results in order to interpret others.  Please give [place] 48 hours in order for your provider to thoroughly review all the results before contacting the office for clarification of your results.   I appreciate the  opportunity to care for you  Thank You   Harl Bowie , MD

## 2020-04-05 NOTE — Progress Notes (Signed)
Alyssa Bryan    [id number]    05/10/1991  Primary Care Physician:[facility], [place], [place]  Referring Physician: Inda Coke, [address] [place],  [place] [address]   Chief complaint: Right upper quadrant abdominal pain HPI:  29 year old very pleasant female here for follow-up visit with complaints of bright upper quadrant abdominal pain.  She started noticing right-sided abdominal pain for past 1 to 2 months, is worse after eating and sometimes with activity.  She also has discomfort in the epigastric region.  Denies any vomiting, dysphagia or odynophagia. She is no longer having rectal bleeding, overall bowel habits are at baseline with no constipation or diarrhea  No family history of IBD or celiac disease  Colonoscopy 11/25/19: Normal colon mucosa, biopsies negative for IBD or microscopic colitis Intermal hemorrhoids 2 small polyps (SSP)    Outpatient Encounter Medications as of 04/05/2020  Medication Sig  . levothyroxine (SYNTHROID) 137 MCG tablet Take 137 mcg by mouth daily before breakfast.   No facility-administered encounter medications on file as of 04/05/2020.    Allergies as of 04/05/2020  . (No Known Allergies)    Past Medical History:  Diagnosis Date  . Thyroid disease     Past Surgical History:  Procedure Laterality Date  . HERNIA REPAIR  1997    Family History  Problem Relation Age of Onset  . Mental retardation Maternal Grandmother   . Pancreatic cancer Maternal Grandfather   . Lung cancer Paternal Grandfather   . Colon cancer Neg Hx   . Breast cancer Neg Hx   . Esophageal cancer Neg Hx   . Liver disease Neg Hx     Social History   Socioeconomic History  . Marital status: Single    Spouse name: Not on file  . Number of children: Not on file  . Years of education: Not on file  . Highest education level: Not on file  Occupational History  . Not on file  Tobacco Use  . Smoking status: Never Smoker  .  Smokeless tobacco: Never Used  Vaping Use  . Vaping Use: Never used  Substance and Sexual Activity  . Alcohol use: Yes    Alcohol/week: 1.0 standard drink    Types: 1 Glasses of wine per week  . Drug use: No  . Sexual activity: Yes    Birth control/protection: None  Other Topics Concern  . Not on file  Social History Narrative   [employer]   Social Determinants of Health   Financial Resource Strain:   . Difficulty of Paying Living Expenses: Not on file  Food Insecurity:   . Worried About Charity fundraiser in the Last Year: Not on file  . Ran Out of Food in the Last Year: Not on file  Transportation Needs:   . Lack of Transportation (Medical): Not on file  . Lack of Transportation (Non-Medical): Not on file  Physical Activity:   . Days of Exercise per Week: Not on file  . Minutes of Exercise per Session: Not on file  Stress:   . Feeling of Stress : Not on file  Social Connections:   . Frequency of Communication with Friends and Family: Not on file  . Frequency of Social Gatherings with Friends and Family: Not on file  . Attends Religious Services: Not on file  . Active Member of Clubs or Organizations: Not on file  . Attends Archivist Meetings: Not on file  .  Marital Status: Not on file  Intimate Partner Violence:   . Fear of Current or Ex-Partner: Not on file  . Emotionally Abused: Not on file  . Physically Abused: Not on file  . Sexually Abused: Not on file      Review of systems: All other review of systems negative except as mentioned in the HPI.   Physical Exam: Vitals:   04/05/20 1051  BP: 110/70  Pulse: 94  SpO2: 99%   Body mass index is 34.29 kg/m. Gen:      No acute distress HEENT:  sclera anicteric Abd:      soft, non-tender; no palpable masses, no distension Ext:    No edema Neuro: alert and oriented x 3 Psych: normal mood and affect  Data Reviewed:  Reviewed labs, radiology imaging, old records and pertinent past GI  work up   Assessment and Plan/Recommendations:  29 year old very pleasant female with right upper quadrant and epigastric abdominal discomfort  Check TTG IgA antibody to exclude celiac disease  We will schedule for EGD to exclude erosive gastritis peptic ulcer disease  Abdominal right upper quadrant ultrasound to exclude gallbladder disease  The risks and benefits as well as alternatives of endoscopic procedure(s) have been discussed and reviewed. All questions answered. The patient agrees to proceed.   History of sessile serrated polyps, due for surveillance colonoscopy in 2026  This visit required 50 minutes of patient care (this includes precharting, chart review, review of results, face-to-face time used for counseling as well as treatment plan and follow-up. The patient was provided an opportunity to ask questions and all were answered. The patient agreed with the plan and demonstrated an understanding of the instructions.  Damaris Hippo , MD    CC: Inda Coke, [place]

## 2020-04-06 ENCOUNTER — Encounter: Payer: Self-pay | Admitting: Gastroenterology

## 2020-04-07 LAB — TISSUE TRANSGLUTAMINASE ABS,IGG,IGA
(tTG) Ab, IgA: <1 U/mL
(tTG) Ab, IgG: <1 U/mL

## 2020-04-10 ENCOUNTER — Ambulatory Visit (HOSPITAL_COMMUNITY): Payer: BLUE CROSS/BLUE SHIELD

## 2020-04-11 ENCOUNTER — Other Ambulatory Visit: Payer: Self-pay

## 2020-04-11 ENCOUNTER — Ambulatory Visit (HOSPITAL_COMMUNITY)
Admission: RE | Admit: 2020-04-11 | Discharge: 2020-04-11 | Disposition: A | Discharge status: Home / Self Care | Payer: BLUE CROSS/BLUE SHIELD | Source: Ambulatory Visit | Attending: Gastroenterology | Admitting: Gastroenterology

## 2020-04-11 DIAGNOSIS — R1011 Right upper quadrant pain: Secondary | ICD-10-CM | POA: Diagnosis not present

## 2020-04-11 DIAGNOSIS — R140 Abdominal distension (gaseous): Secondary | ICD-10-CM | POA: Insufficient documentation

## 2020-04-11 DIAGNOSIS — R1013 Epigastric pain: Secondary | ICD-10-CM | POA: Insufficient documentation

## 2020-04-12 ENCOUNTER — Encounter: Payer: Self-pay | Admitting: Gastroenterology

## 2020-04-14 ENCOUNTER — Encounter: Payer: Self-pay | Admitting: Gastroenterology

## 2020-04-17 ENCOUNTER — Ambulatory Visit (AMBULATORY_SURGERY_CENTER): Payer: BLUE CROSS/BLUE SHIELD | Admitting: Gastroenterology

## 2020-04-17 ENCOUNTER — Encounter: Payer: Self-pay | Admitting: Gastroenterology

## 2020-04-17 ENCOUNTER — Other Ambulatory Visit: Payer: Self-pay

## 2020-04-17 VITALS — BP 119/65 | HR 75 | Temp 98.0°F | Resp 18 | Ht 69.0 in | Wt 232.0 lb

## 2020-04-17 DIAGNOSIS — K2970 Gastritis, unspecified, without bleeding: Secondary | ICD-10-CM

## 2020-04-17 DIAGNOSIS — K2100 Gastro-esophageal reflux disease with esophagitis, without bleeding: Secondary | ICD-10-CM | POA: Diagnosis present

## 2020-04-17 DIAGNOSIS — K449 Diaphragmatic hernia without obstruction or gangrene: Secondary | ICD-10-CM

## 2020-04-17 DIAGNOSIS — R1011 Right upper quadrant pain: Secondary | ICD-10-CM

## 2020-04-17 MED ORDER — SODIUM CHLORIDE 0.9 % IV SOLN: 500.0000 mL | Freq: Once | INTRAVENOUS | Status: DC

## 2020-04-17 MED ORDER — OMEPRAZOLE 40 MG PO CPDR: 40.0000 mg | DELAYED_RELEASE_CAPSULE | Freq: Every day | ORAL | 0 refills | Status: DC

## 2020-04-17 NOTE — Progress Notes (Signed)
To PACU, VSS. Report to [email or web address] 

## 2020-04-17 NOTE — Progress Notes (Signed)
Called to room to assist during endoscopic procedure.  Patient ID and intended procedure confirmed with present staff. Received instructions for my participation in the procedure from the performing physician.  

## 2020-04-17 NOTE — Op Note (Signed)
[facility] Patient Name: Alyssa Bryan Procedure Date: 04/17/2020 3:25 PM MRN: [id number] Endoscopist: Mauri Pole , MD Age: 29 Referring MD:  Date of Birth: 1991-05-29 Gender: Female Account #: [id number] Procedure:                Upper GI endoscopy Indications:              Abdominal pain in the right upper quadrant,                            Suspected esophageal reflux Medicines:                Monitored Anesthesia Care Procedure:                Pre-Anesthesia Assessment:                           - Prior to the procedure, a History and Physical                            was performed, and patient medications and                            allergies were reviewed. The patient's tolerance of                            previous anesthesia was also reviewed. The risks                            and benefits of the procedure and the sedation                            options and risks were discussed with the patient.                            All questions were answered, and informed consent                            was obtained. Prior Anticoagulants: The patient has                            taken no previous anticoagulant or antiplatelet                            agents. ASA Grade Assessment: II - A patient with                            mild systemic disease. After reviewing the risks                            and benefits, the patient was deemed in                            satisfactory condition to undergo the procedure.  After obtaining informed consent, the endoscope was                            passed under direct vision. Throughout the                            procedure, the patient's blood pressure, pulse, and                            oxygen saturations were monitored continuously. The                            Endoscope was introduced through the mouth, and                            advanced to the second  part of duodenum. The upper                            GI endoscopy was accomplished without difficulty.                            The patient tolerated the procedure well. Scope In: Scope Out: Findings:                 LA Grade B (one or more mucosal breaks greater than                            5 mm, not extending between the tops of two mucosal                            folds) esophagitis with no bleeding was found 34 to                            36 cm from the incisors.                           A small hiatal hernia was present.                           The gastroesophageal flap valve was visualized                            endoscopically and classified as Hill Grade IV (no                            fold, wide open lumen, hiatal hernia present).                           Patchy mild inflammation characterized by                            congestion (edema), erosions and erythema was found  in the prepyloric region of the stomach. Biopsies                            were taken with a cold forceps for Helicobacter                            pylori testing.                           The first portion of the duodenum and second                            portion of the duodenum were normal. Biopsies for                            histology were taken with a cold forceps for                            evaluation of celiac disease. Biopsies for                            histology were taken with a cold forceps for                            evaluation of celiac disease. Complications:            No immediate complications. Estimated Blood Loss:     Estimated blood loss was minimal. Impression:               - LA Grade B reflux esophagitis with no bleeding.                           - Small hiatal hernia.                           - Gastroesophageal flap valve classified as Hill                            Grade IV (no fold, wide open lumen, hiatal hernia                             present).                           - Gastritis. Biopsied.                           - Normal first portion of the duodenum and second                            portion of the duodenum. Biopsied. Recommendation:           - Patient has a contact number available for                            emergencies. The signs and  symptoms of potential                            delayed complications were discussed with the                            patient. Return to normal activities tomorrow.                            Written discharge instructions were provided to the                            patient.                           - Resume previous diet.                           - Continue present medications.                           - Await pathology results.                           - Use Prilosec (omeprazole) 40 mg PO daily for 3                            months.                           - Follow an antireflux regimen indefinitely.                           - Return to GI office in 3 months. Please call to                            schedule appointment Mauri Pole, MD 04/17/2020 3:43:23 PM This report has been signed electronically.

## 2020-04-17 NOTE — Patient Instructions (Addendum)
Handout for Hiatal Hernia and Gastritis given.  Pick up RX  YOU HAD AN ENDOSCOPIC PROCEDURE TODAY AT [facility]:   Refer to the procedure report that was given to you for any specific questions about what was found during the examination.  If the procedure report does not answer your questions, please call your gastroenterologist to clarify.  If you requested that your care partner not be given the details of your procedure findings, then the procedure report has been included in a sealed envelope for you to review at your convenience later.  YOU SHOULD EXPECT: Some feelings of bloating in the abdomen. Passage of more gas than usual.  Walking can help get rid of the air that was put into your GI tract during the procedure and reduce the bloating. If you had a lower endoscopy (such as a colonoscopy or flexible sigmoidoscopy) you may notice spotting of blood in your stool or on the toilet paper. If you underwent a bowel prep for your procedure, you may not have a normal bowel movement for a few days.  Please Note:  You might notice some irritation and congestion in your nose or some drainage.  This is from the oxygen used during your procedure.  There is no need for concern and it should clear up in a day or so.  SYMPTOMS TO REPORT IMMEDIATELY:   Following upper endoscopy (EGD)  Vomiting of blood or coffee ground material  New chest pain or pain under the shoulder blades  Painful or persistently difficult swallowing  New shortness of breath  Fever of 100F or higher  Black, tarry-looking stools  For urgent or emergent issues, a gastroenterologist can be reached at any hour by calling [phone]. Do not use MyChart messaging for urgent concerns.    DIET:  We do recommend a small meal at first, but then you may proceed to your regular diet.  Drink plenty of fluids but you should avoid alcoholic beverages for 24 hours.  ACTIVITY:  You should plan to take it easy for the  rest of today and you should NOT DRIVE or use heavy machinery until tomorrow (because of the sedation medicines used during the test).    FOLLOW UP: Our staff will call the number listed on your records 48-72 hours following your procedure to check on you and address any questions or concerns that you may have regarding the information given to you following your procedure. If we do not reach you, we will leave a message.  We will attempt to reach you two times.  During this call, we will ask if you have developed any symptoms of COVID 19. If you develop any symptoms (ie: fever, flu-like symptoms, shortness of breath, cough etc.) before then, please call [phone].  If you test positive for Covid 19 in the 2 weeks post procedure, please call and report this information to [place].    If any biopsies were taken you will be contacted by phone or by letter within the next 1-3 weeks.  Please call us at [phone] if you have not heard about the biopsies in 3 weeks.    SIGNATURES/CONFIDENTIALITY: You and/or your care partner have signed paperwork which will be entered into your electronic medical record.  These signatures attest to the fact that that the information above on your After Visit Summary has been reviewed and is understood.  Full responsibility of the confidentiality of this discharge information lies with you and/or your care-partner.

## 2020-04-18 ENCOUNTER — Encounter: Payer: Self-pay | Admitting: Gastroenterology

## 2020-04-19 ENCOUNTER — Telehealth: Payer: Self-pay

## 2020-04-19 ENCOUNTER — Telehealth: Payer: Self-pay | Admitting: *Deleted

## 2020-04-19 NOTE — Telephone Encounter (Signed)
Left message on follow up call. 

## 2020-04-19 NOTE — Telephone Encounter (Signed)
Attempted 2nd f/u phone call. No answer. Left message.  °

## 2020-04-25 ENCOUNTER — Encounter: Payer: Self-pay | Admitting: Gastroenterology

## 2020-07-15 ENCOUNTER — Other Ambulatory Visit: Payer: Self-pay | Admitting: Gastroenterology

## 2021-07-20 IMAGING — US US ABDOMEN LIMITED
1 series · 14 of 25 positions shown · non-contrast
Comparison: None.

CLINICAL DATA: Right upper quadrant pain.

EXAM:
ULTRASOUND ABDOMEN LIMITED RIGHT UPPER QUADRANT

[Series 1: us abdomen limited · 14 of 33 slices shown]
[im 1/33]
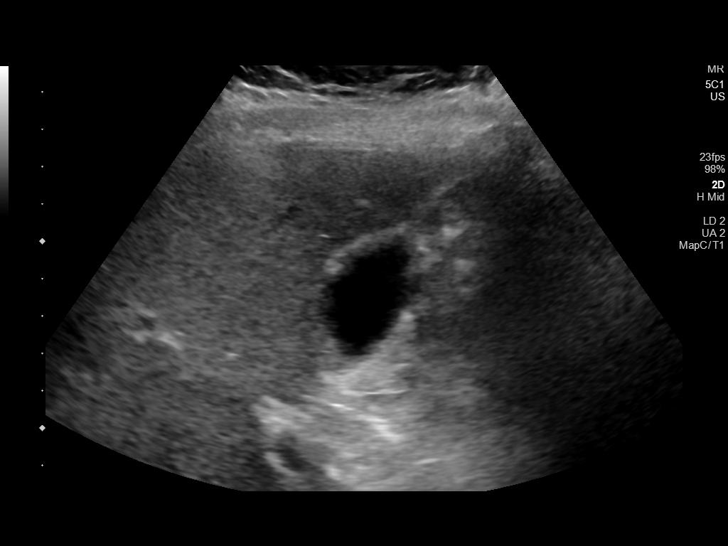
[im 3/33]
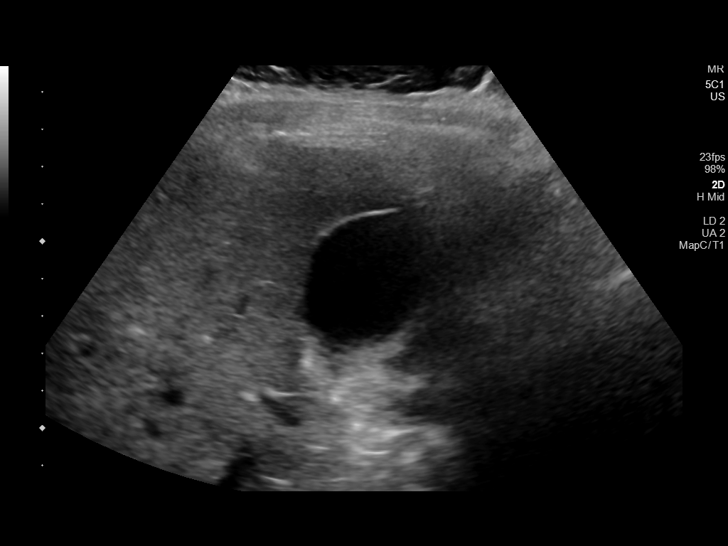
[im 6/33]
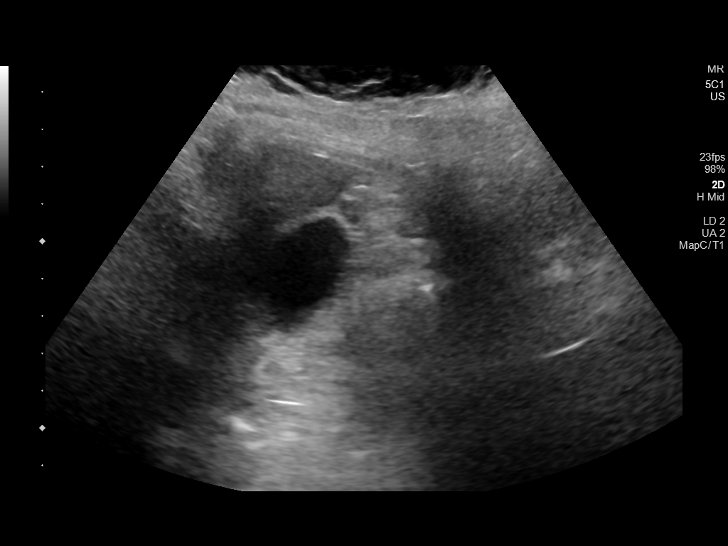
[im 9/33]
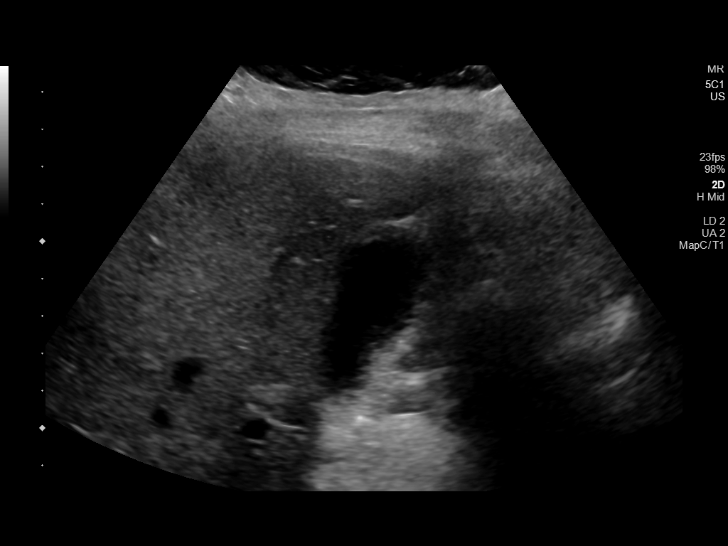
[im 11/33]
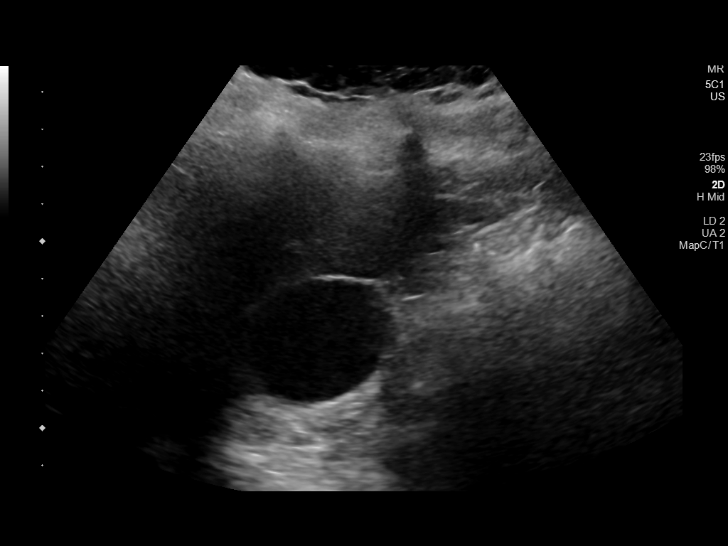
[im 13/33]
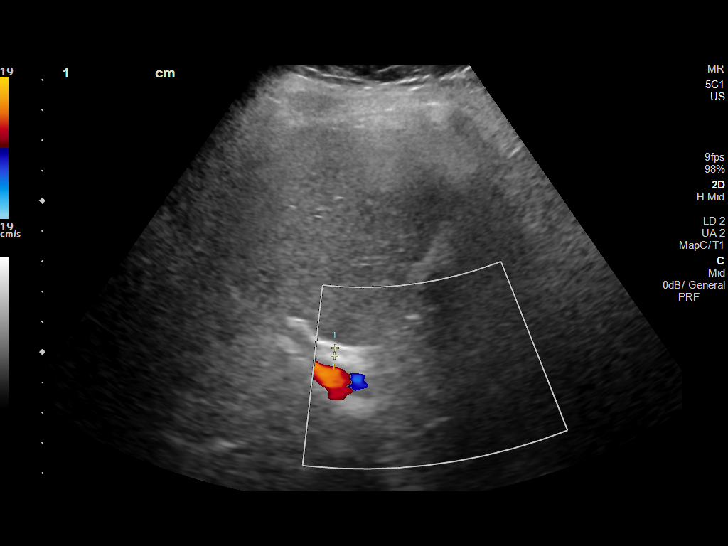
[im 15/33]
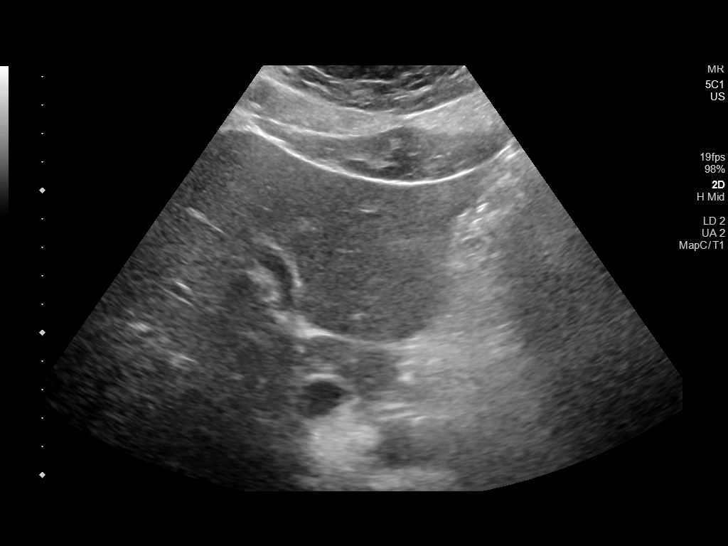
[im 18/33]
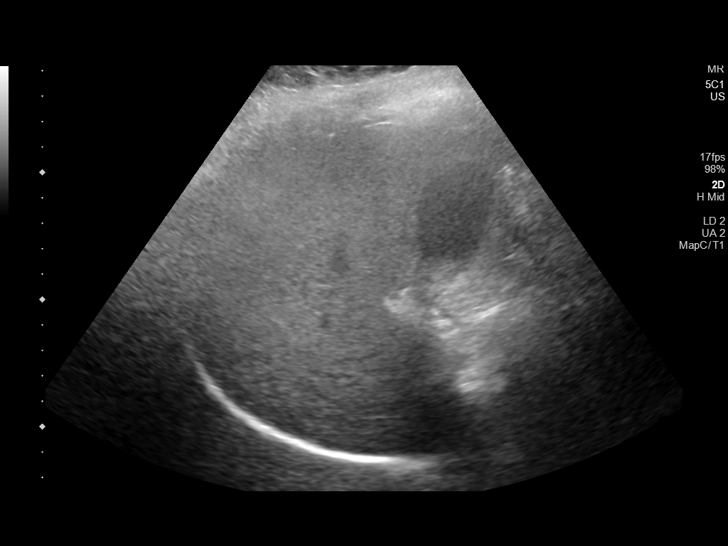
[im 21/33]
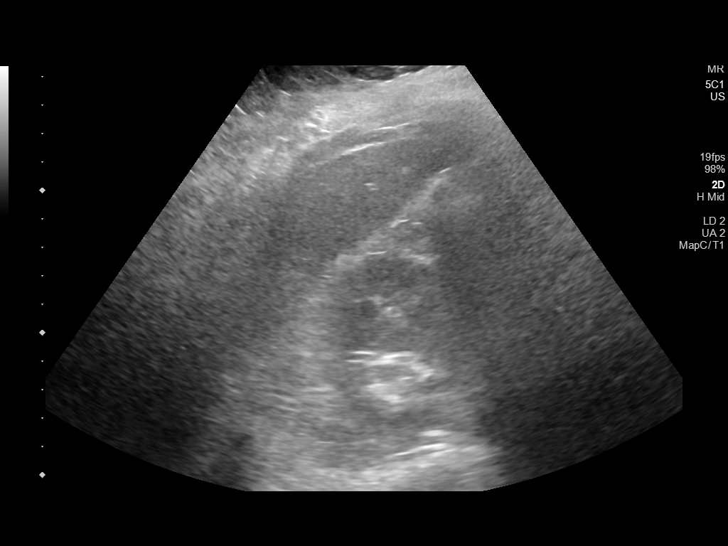
[im 22/33]
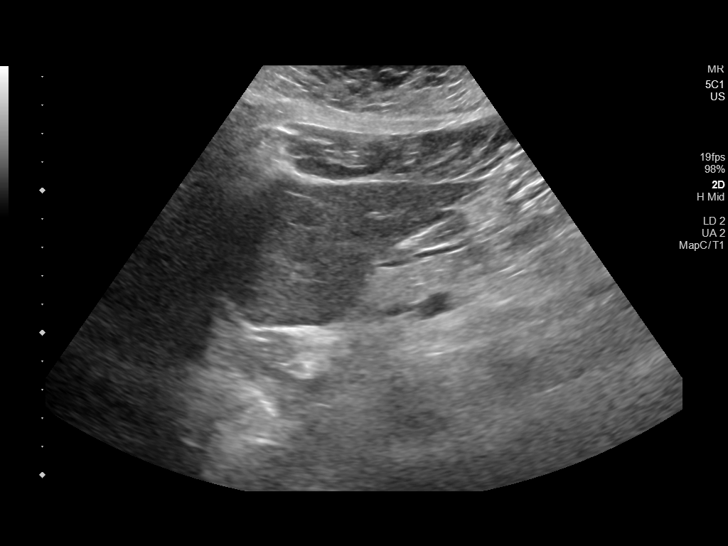
[im 25/33]
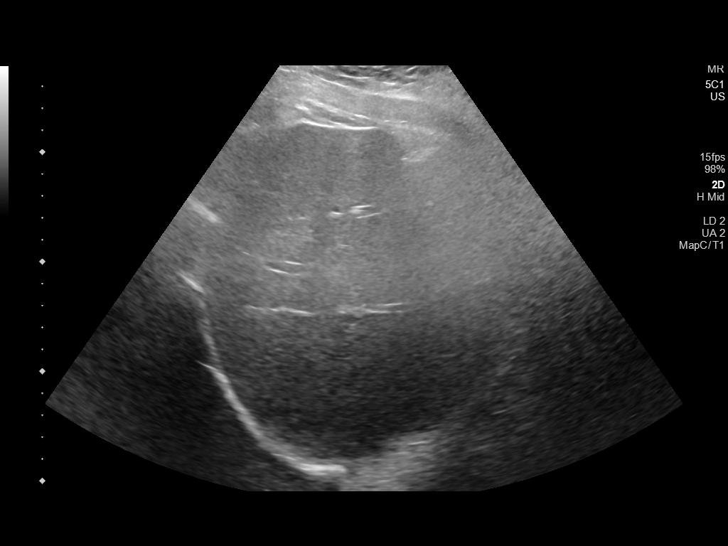
[im 27/33]
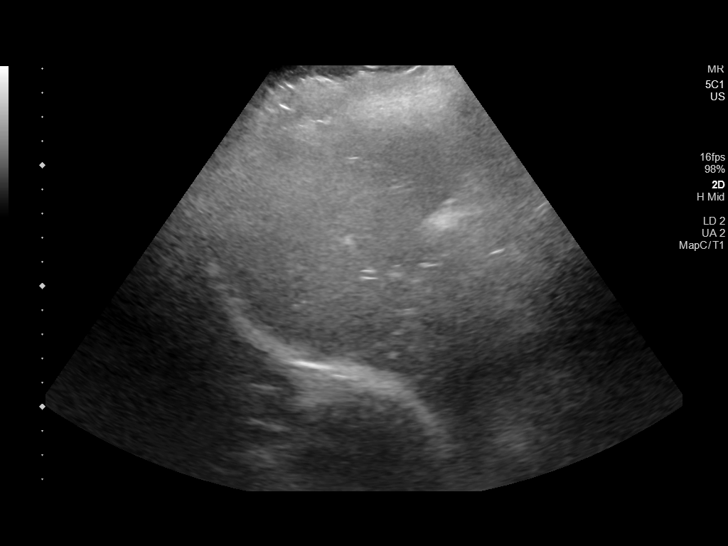
[im 30/33]
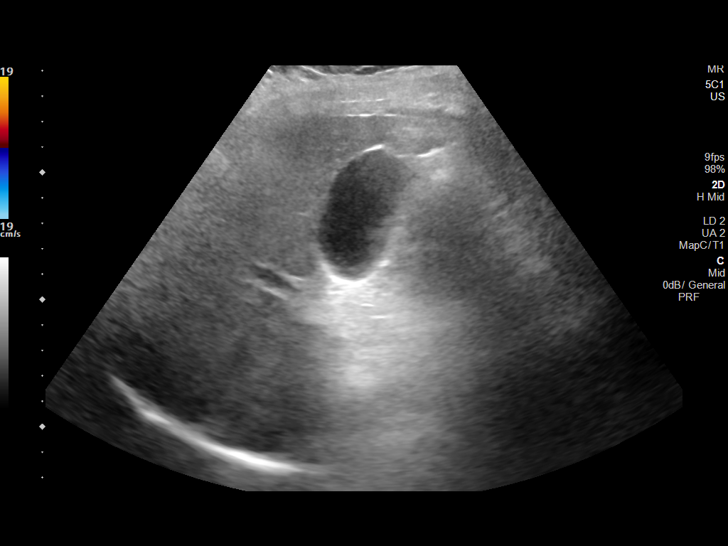
[im 33/33]
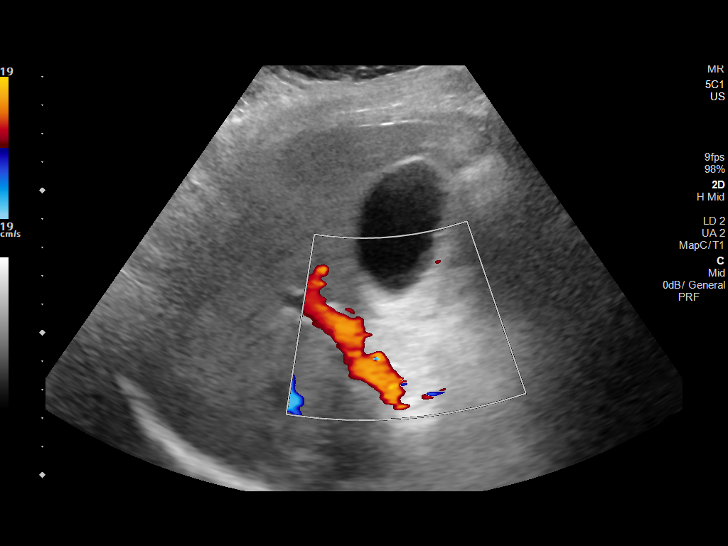

[14 of 25 positions shown; findings below may reference images not displayed]

FINDINGS: Gallbladder:

No gallstones or wall thickening visualized. No sonographic Murphy
sign noted by sonographer.

Common bile duct:

Diameter: 3 mm, normal.

Liver:

No focal lesion identified. Heterogeneously increased parenchymal
echogenicity. Portal vein is patent on color Doppler imaging with
normal direction of blood flow towards the liver.

Other: None.
IMPRESSION: 1. No acute abnormality.
2. Hepatic steatosis.

## 2022-04-01 ENCOUNTER — Encounter: Payer: Self-pay | Admitting: *Deleted

## 2022-05-07 ENCOUNTER — Other Ambulatory Visit: Payer: Self-pay | Admitting: Endocrinology

## 2022-05-07 DIAGNOSIS — E041 Nontoxic single thyroid nodule: Secondary | ICD-10-CM

## 2022-05-17 ENCOUNTER — Other Ambulatory Visit: Payer: Self-pay | Admitting: Endocrinology

## 2022-05-17 ENCOUNTER — Ambulatory Visit
Admission: RE | Admit: 2022-05-17 | Discharge: 2022-05-17 | Disposition: A | Discharge status: Home / Self Care | Payer: BLUE CROSS/BLUE SHIELD | Source: Ambulatory Visit | Attending: Endocrinology | Admitting: Endocrinology

## 2022-05-17 DIAGNOSIS — E041 Nontoxic single thyroid nodule: Secondary | ICD-10-CM

## 2022-06-20 ENCOUNTER — Encounter: Payer: Self-pay | Admitting: *Deleted

## 2022-07-08 ENCOUNTER — Ambulatory Visit (HOSPITAL_COMMUNITY)
Admission: EM | Admit: 2022-07-08 | Discharge: 2022-07-08 | Disposition: A | Discharge status: Home / Self Care | Payer: BLUE CROSS/BLUE SHIELD | Attending: Internal Medicine | Admitting: Internal Medicine

## 2022-07-08 ENCOUNTER — Encounter (HOSPITAL_COMMUNITY): Payer: Self-pay

## 2022-07-08 ENCOUNTER — Ambulatory Visit (INDEPENDENT_AMBULATORY_CARE_PROVIDER_SITE_OTHER): Payer: BLUE CROSS/BLUE SHIELD

## 2022-07-08 DIAGNOSIS — M25431 Effusion, right wrist: Secondary | ICD-10-CM | POA: Diagnosis not present

## 2022-07-08 DIAGNOSIS — M25531 Pain in right wrist: Secondary | ICD-10-CM | POA: Diagnosis not present

## 2022-07-08 NOTE — Discharge Instructions (Signed)
Wrist brace as needed for support and stabilization Take over-the-counter ibuprofen 600 to 800 mg every 8 hours as needed.  Stay hydrated while you are on this medication Rest, elevate, ice to the wrist as needed Please follow-up with your PCP if symptoms or not improving Please go to the emergency room if any symptoms are worsening

## 2022-07-08 NOTE — ED Provider Notes (Signed)
[place]    CSN: [id number] Arrival date & time: 07/08/22  1858      History   Chief Complaint Chief Complaint  Patient presents with   Wrist Pain    HPI [name] is a 32 y.o. female resents for evaluation of wrist pain.  Patient reports 1 to 2 days of right wrist swelling and pain.  Denies any known injury or inciting event.  Reports pain radiates into her hand as well as her forearm.  Denies numbness or tingling or weakness of the right hand/wrist.  No history of right wrist injuries/surgeries or fractures in the past.  She did take 400 mg of ibuprofen earlier today that did help temporarily.  No history of carpal tunnel.  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  No other concerns at this time.   Wrist Pain    Past Medical History:  Diagnosis Date   Thyroid disease     Patient Active Problem List   Diagnosis Date Noted   Rectal bleeding 11/12/2019   Right lower quadrant abdominal pain 02/58/5277   Umbilical pain 82/42/3536   COVID-19 virus detected July 2020 01/11/2019   Hypothyroidism 05/20/2013   Overweight (BMI 25.0-29.9) 05/20/2013    Past Surgical History:  Procedure Laterality Date   HERNIA REPAIR  1997    OB History     Gravida  1   Para      Term      Preterm      AB      Living         SAB      IAB      Ectopic      Multiple      Live Births               Home Medications    Prior to Admission medications   Medication Sig Start Date End Date Taking? Authorizing Provider  levothyroxine (SYNTHROID) 137 MCG tablet Take 137 mcg by mouth daily before breakfast.    [provider]  omeprazole (PRILOSEC) 40 MG capsule TAKE 1 CAPSULE BY MOUTH EVERY DAY 07/17/20   Mauri Pole, MD    Family History Family History  Problem Relation Age of Onset   Mental retardation Maternal Grandmother    Pancreatic cancer Maternal Grandfather    Lung cancer Paternal Grandfather     Colon cancer Neg Hx    Breast cancer Neg Hx    Esophageal cancer Neg Hx    Liver disease Neg Hx    Ulcerative colitis Neg Hx    Stomach cancer Neg Hx     Social History Social History   Tobacco Use   Smoking status: Never   Smokeless tobacco: Never  Vaping Use   Vaping Use: Never used  Substance Use Topics   Alcohol use: Yes    Alcohol/week: 1.0 standard drink of alcohol    Types: 1 Glasses of wine per week   Drug use: No     Allergies   Patient has no known allergies.   Review of Systems Review of Systems  Musculoskeletal:        Right wrist pain      Physical Exam Triage Vital Signs ED Triage Vitals  Enc Vitals Group     BP 07/08/22 1951 137/81     Pulse Rate 07/08/22 1951 78     Resp 07/08/22 1951 16     Temp 07/08/22  1951 98.2 F (36.8 C)     Temp Source 07/08/22 1951 Oral     SpO2 07/08/22 1951 98 %     Weight --      Height --      Head Circumference --      Peak Flow --      Pain Score 07/08/22 1953 7     Pain Loc --      Pain Edu? --      Excl. in [place]? --    No data found.  Updated Vital Signs BP 137/81 (BP Location: Left Arm)   Pulse 78   Temp 98.2 F (36.8 C) (Oral)   Resp 16   LMP 06/22/2022 (Exact Date)   SpO2 98%   Visual Acuity Right Eye Distance:   Left Eye Distance:   Bilateral Distance:    Right Eye Near:   Left Eye Near:    Bilateral Near:     Physical Exam Vitals and nursing note reviewed.  Constitutional:      Appearance: Normal appearance.  HENT:     Head: Normocephalic and atraumatic.  Eyes:     Pupils: Pupils are equal, round, and reactive to light.  Cardiovascular:     Rate and Rhythm: Normal rate.  Pulmonary:     Effort: Pulmonary effort is normal.  Musculoskeletal:     Right wrist: Swelling, tenderness, bony tenderness and snuff box tenderness present. No deformity, effusion, lacerations or crepitus. Normal range of motion. Normal pulse.     Left wrist: Normal.     Comments: No erythema or warmth of the  wrist.  Tenderness of the wrist extends to mid dorsum of hand.  Cap refill +2 in all fingers.  Skin is warm and dry.  Patient unable to dorsiflex or extend the wrist due to pain.  Strength bilateral upper extremities 5 out of 5.  Patient is able to flex all fingers into a fist but does have pain with extension of all fingers.  Skin:    General: Skin is warm and dry.  Neurological:     General: No focal deficit present.     Mental Status: She is alert and oriented to person, place, and time.  Psychiatric:        Mood and Affect: Mood normal.        Behavior: Behavior normal.      UC Treatments / Results  Labs (all labs ordered are listed, but only abnormal results are displayed) Labs Reviewed - No data to display  EKG   Radiology DG Wrist Complete Right  Result Date: 07/08/2022 CLINICAL DATA:  Swelling pain EXAM: RIGHT WRIST - COMPLETE 3+ VIEW COMPARISON:  None Available. FINDINGS: There is no evidence of fracture or dislocation. There is no evidence of arthropathy or other focal bone abnormality. Soft tissues are unremarkable. IMPRESSION: Negative. Electronically Signed   By: Donavan Foil M.D.   On: 07/08/2022 20:23    Procedures Procedures (including critical care time)  Medications Ordered in UC Medications - No data to display  Initial Impression / Assessment and Plan / UC Course  I have reviewed the triage vital signs and the nursing notes.  Pertinent labs & imaging results that were available during my care of the patient were reviewed by me and considered in my medical decision making (see chart for details).     Reviewed exam and symptoms with patient.  X-ray negative for fracture.   Unclear cause of swelling and pain.   strain  versus overuse injury? RICE therapy and patient placed in thumb spica given snuffbox tenderness. Over-the-counter NSAIDs 600-800 mg p.o. every 8 hours as needed Advised to follow-up with PCP next week if symptoms are not improving for  additional imaging if indicated ER precautions reviewed and patient verbalized understanding Final Clinical Impressions(s) / UC Diagnoses   Final diagnoses:  Pain and swelling of right wrist     Discharge Instructions      Wrist brace as needed for support and stabilization Take over-the-counter ibuprofen 600 to 800 mg every 8 hours as needed.  Stay hydrated while you are on this medication Rest, elevate, ice to the wrist as needed Please follow-up with your PCP if symptoms or not improving Please go to the emergency room if any symptoms are worsening     ED Prescriptions   None    PDMP not reviewed this encounter.   Melynda Ripple, NP 07/08/22 2033

## 2022-07-08 NOTE — ED Triage Notes (Signed)
Patient with right wrist pain. States it woke up from sleep during the night. Wrist swollen on exam. Tried ibuprofen with no relief. Denies any injury.

## 2023-04-16 NOTE — H&P (Addendum)
Alyssa Bryan is an 32 y.o. female. Presenting for missed miscarriage and surgical management. US showed IUGS x 2 measuring 5 weeks. Most recently a YS but > 14 days after first scan diagnostic of missed AB. She has a history of thyroid disease that is well controlled.    Past Medical History:  Diagnosis Date   Thyroid disease     Past Surgical History:  Procedure Laterality Date   HERNIA REPAIR  1997    Family History  Problem Relation Age of Onset   Mental retardation Maternal Grandmother    Pancreatic cancer Maternal Grandfather    Lung cancer Paternal Grandfather    Colon cancer Neg Hx    Breast cancer Neg Hx    Esophageal cancer Neg Hx    Liver disease Neg Hx    Ulcerative colitis Neg Hx    Stomach cancer Neg Hx     Social History:  reports that she has never smoked. She has never used smokeless tobacco. She reports current alcohol use of about 1.0 standard drink of alcohol per week. She reports that she does not use drugs.  Allergies:  Allergies  Allergen Reactions   Cherry Swelling    Lip swelling    No medications prior to admission.    Review of Systems  There were no vitals taken for this visit. Physical Exam Gen: well appearing, NAD CV: Reg rate Pulm: NWOB Abd: soft, nondistended, nontender, no masses GYN: uterus 7 week size, no adnexa ttp/CMT Ext: No edema b/l  No results found for this or any previous visit (from the past 24 hour(s)).  No results found.  Assessment/Plan: 32 yo with missed ab at 6 wga - 2 GS - twin pregnancy? Only one ys. Desires anora testing.  Pt counseled on TVUS results and diagnosis of MAB. Discussed that it is unlikely to be her fault nor could she have prevented it. Reviewed that this miscarriage does not likely reflect her ability to have a successful pregnancy in the future, and that miscarriage is common - 1:5 pregnancies. She was counseled on options for managing missed ab including expectant, medical, and surgical  management.  Patient desires definitive surgical management with suction dilation and curettage.   Risks/benefits/ and alternatives reviewed with patient with risks including but not limited to bleeding, infection, uterine perforation, and damage to nearby structures such as the bowel, bladder, vessels, and/or other organs. She was given opportunity to ask questions and all questions answered. Patient consents to proceed with suction dilation and curettage. Reviewed bleeding precautions. Her blood type is A+ and she does not need Rhogam. Given instructions and she verbalizes understanding. Discussed the importance of having at least one normal periods after completion of the process, before attempting conception again. Discussed S/S to call back for. All questions answered and pt verbalizes understanding w/out further questions/concerns. Risks discussed including infection, bleeding, damage to surrounding structures, need for additional procedures, postoperative DVT and subsequent scarring. All questions answered. Consent signed in office.  Plan on doxycycline postop and consider cytotec x 3 days. She has this RX.    Ranae Pila 04/16/2023, 4:54 PM

## 2023-04-18 ENCOUNTER — Other Ambulatory Visit: Payer: Self-pay

## 2023-04-18 ENCOUNTER — Encounter (HOSPITAL_COMMUNITY): Payer: Self-pay | Admitting: *Deleted

## 2023-04-18 NOTE — Progress Notes (Signed)
PCP - Jarold Motto, PA Cardiologist - none  Chest x-ray - n/a EKG - n/a Stress Test - n/a ECHO - n/a Cardiac Cath - n/a  ICD Pacemaker/Loop - n/a  Sleep Study -  n/a CPAP - none  Diabetes - n/a  NPO  STOP now taking any Aspirin (unless otherwise instructed by your surgeon), Aleve, Naproxen, Ibuprofen, Motrin, Advil, Goody's, BC's, all herbal medications, fish oil, and all vitamins.   Coronavirus Screening Do you have any of the following symptoms:  Cough yes/no: No Fever (>100.46F)  yes/no: No Runny nose yes/no: No Sore throat yes/no: No Difficulty breathing/shortness of breath  yes/no: No  Have you traveled in the last 14 days and where? yes/no: No  Patient verbalized understanding of instructions that were given via phone.

## 2023-04-21 NOTE — Anesthesia Preprocedure Evaluation (Addendum)
Anesthesia Evaluation  Patient identified by MRN, date of birth, ID band Patient awake    Reviewed: Allergy & Precautions, NPO status , Patient's Chart, lab work & pertinent test results  Airway Mallampati: I  TM Distance: >3 FB Neck ROM: Full    Dental no notable dental hx. (+) Dental Advisory Given, Teeth Intact   Pulmonary neg pulmonary ROS   Pulmonary exam normal breath sounds clear to auscultation       Cardiovascular negative cardio ROS Normal cardiovascular exam Rhythm:Regular Rate:Normal     Neuro/Psych negative neurological ROS     GI/Hepatic Neg liver ROS,GERD  Controlled,,  Endo/Other  Hypothyroidism    Renal/GU negative Renal ROS     Musculoskeletal negative musculoskeletal ROS (+)    Abdominal  (+) + obese  Peds  Hematology negative hematology ROS (+)   Anesthesia Other Findings   Reproductive/Obstetrics                             Anesthesia Physical Anesthesia Plan  ASA: 2  Anesthesia Plan: General   Post-op Pain Management: Tylenol PO (pre-op)* and Toradol IV (intra-op)*   Induction: Intravenous  PONV Risk Score and Plan: 4 or greater and Ondansetron, Dexamethasone, Treatment may vary due to age or medical condition, Midazolam and Scopolamine patch - Pre-op  Airway Management Planned: LMA  Additional Equipment:   Intra-op Plan:   Post-operative Plan: Extubation in OR  Informed Consent: I have reviewed the patients History and Physical, chart, labs and discussed the procedure including the risks, benefits and alternatives for the proposed anesthesia with the patient or authorized representative who has indicated his/her understanding and acceptance.     Dental advisory given  Plan Discussed with: CRNA  Anesthesia Plan Comments:         Anesthesia Quick Evaluation

## 2023-04-22 ENCOUNTER — Ambulatory Visit (HOSPITAL_COMMUNITY): Payer: BLUE CROSS/BLUE SHIELD | Admitting: Anesthesiology

## 2023-04-22 ENCOUNTER — Other Ambulatory Visit: Payer: Self-pay

## 2023-04-22 ENCOUNTER — Ambulatory Visit (HOSPITAL_COMMUNITY)
Admission: RE | Admit: 2023-04-22 | Discharge: 2023-04-22 | Disposition: A | Discharge status: Home / Self Care | Payer: BLUE CROSS/BLUE SHIELD | Attending: Obstetrics and Gynecology | Admitting: Obstetrics and Gynecology

## 2023-04-22 ENCOUNTER — Encounter (HOSPITAL_COMMUNITY): Payer: Self-pay | Admitting: Obstetrics and Gynecology

## 2023-04-22 ENCOUNTER — Encounter (HOSPITAL_COMMUNITY)
Admission: RE | Disposition: A | Discharge status: Home / Self Care | Payer: Self-pay | Source: Home / Self Care | Attending: Obstetrics and Gynecology

## 2023-04-22 DIAGNOSIS — O021 Missed abortion: Secondary | ICD-10-CM | POA: Insufficient documentation

## 2023-04-22 HISTORY — PX: DILATION AND EVACUATION: SHX1459

## 2023-04-22 HISTORY — DX: Hypothyroidism, unspecified: E03.9

## 2023-04-22 HISTORY — DX: Gastro-esophageal reflux disease without esophagitis: K21.9

## 2023-04-22 LAB — CBC
HCT: 38.4 % (ref 36.0–46.0)
Hemoglobin: 12.9 g/dL (ref 12.0–15.0)
MCH: 29.6 pg (ref 26.0–34.0)
MCHC: 33.6 g/dL (ref 30.0–36.0)
MCV: 88.1 fL (ref 80.0–100.0)
Platelets: 350 10*3/uL (ref 150–400)
RBC: 4.36 MIL/uL (ref 3.87–5.11)
RDW: 12.7 % (ref 11.5–15.5)
WBC: 9.8 10*3/uL (ref 4.0–10.5)
nRBC: 0 % (ref 0.0–0.2)

## 2023-04-22 LAB — TYPE AND SCREEN
ABO/RH(D): A POS
Antibody Screen: NEGATIVE

## 2023-04-22 SURGERY — DILATION AND EVACUATION, UTERUS
Anesthesia: General | Site: Perineum

## 2023-04-22 MED ORDER — MISOPROSTOL 200 MCG PO TABS
ORAL_TABLET | ORAL | Status: AC
  Filled 2023-04-22: qty 4

## 2023-04-22 MED ORDER — SCOPOLAMINE 1 MG/3DAYS TD PT72
MEDICATED_PATCH | TRANSDERMAL | Status: AC
  Administered 2023-04-22: 1.5 mg via TRANSDERMAL
  Filled 2023-04-22: qty 1

## 2023-04-22 MED ORDER — 0.9 % SODIUM CHLORIDE (POUR BTL) OPTIME
TOPICAL | Status: DC | PRN
Start: 2023-04-22 — End: 2023-04-22
  Administered 2023-04-22: 1000 mL

## 2023-04-22 MED ORDER — PROPOFOL 10 MG/ML IV BOLUS
INTRAVENOUS | Status: AC
  Filled 2023-04-22: qty 20

## 2023-04-22 MED ORDER — CEFAZOLIN SODIUM-DEXTROSE 2-4 GM/100ML-% IV SOLN
INTRAVENOUS | Status: AC
  Filled 2023-04-22: qty 100

## 2023-04-22 MED ORDER — MIDAZOLAM HCL 2 MG/2ML IJ SOLN
INTRAMUSCULAR | Status: AC
  Filled 2023-04-22: qty 2

## 2023-04-22 MED ORDER — CEFAZOLIN SODIUM-DEXTROSE 2-3 GM-%(50ML) IV SOLR
INTRAVENOUS | Status: DC | PRN
Start: 2023-04-22 — End: 2023-04-22
  Administered 2023-04-22: 2 g via INTRAVENOUS

## 2023-04-22 MED ORDER — TRANEXAMIC ACID-NACL 1000-0.7 MG/100ML-% IV SOLN
INTRAVENOUS | Status: AC
  Filled 2023-04-22: qty 100

## 2023-04-22 MED ORDER — ONDANSETRON HCL 4 MG/2ML IJ SOLN
INTRAMUSCULAR | Status: DC | PRN
  Administered 2023-04-22: 4 mg via INTRAVENOUS

## 2023-04-22 MED ORDER — ACETAMINOPHEN 500 MG PO TABS
ORAL_TABLET | ORAL | Status: AC
  Administered 2023-04-22: 1000 mg via ORAL
  Filled 2023-04-22: qty 2

## 2023-04-22 MED ORDER — CHLORHEXIDINE GLUCONATE 0.12 % MT SOLN: 15.0000 mL | Freq: Once | OROMUCOSAL | Status: AC

## 2023-04-22 MED ORDER — TRANEXAMIC ACID-NACL 1000-0.7 MG/100ML-% IV SOLN
1000.0000 mg | Freq: Once | INTRAVENOUS | Status: AC
  Administered 2023-04-22: 1000 mg via INTRAVENOUS

## 2023-04-22 MED ORDER — LIDOCAINE 2% (20 MG/ML) 5 ML SYRINGE
INTRAMUSCULAR | Status: DC | PRN
  Administered 2023-04-22: 100 mg via INTRAVENOUS

## 2023-04-22 MED ORDER — ACETAMINOPHEN 500 MG PO TABS: 1000.0000 mg | ORAL_TABLET | Freq: Once | ORAL | Status: AC

## 2023-04-22 MED ORDER — CHLORHEXIDINE GLUCONATE 0.12 % MT SOLN
OROMUCOSAL | Status: AC
  Administered 2023-04-22: 15 mL via OROMUCOSAL
  Filled 2023-04-22: qty 15

## 2023-04-22 MED ORDER — SCOPOLAMINE 1 MG/3DAYS TD PT72: 1.0000 | MEDICATED_PATCH | TRANSDERMAL | Status: DC

## 2023-04-22 MED ORDER — METHYLERGONOVINE MALEATE 0.2 MG/ML IJ SOLN
INTRAMUSCULAR | Status: AC
  Filled 2023-04-22: qty 1

## 2023-04-22 MED ORDER — SILVER NITRATE-POT NITRATE 75-25 % EX MISC
CUTANEOUS | Status: AC
  Filled 2023-04-22: qty 10

## 2023-04-22 MED ORDER — CHLOROPROCAINE HCL 1 % IJ SOLN
INTRAMUSCULAR | Status: DC | PRN
  Administered 2023-04-22: 2.5 mg

## 2023-04-22 MED ORDER — PROPOFOL 10 MG/ML IV BOLUS
INTRAVENOUS | Status: DC | PRN
  Administered 2023-04-22: 100 mg via INTRAVENOUS
  Administered 2023-04-22: 200 mg via INTRAVENOUS
  Administered 2023-04-22: 100 mg via INTRAVENOUS

## 2023-04-22 MED ORDER — DEXAMETHASONE SODIUM PHOSPHATE 10 MG/ML IJ SOLN
INTRAMUSCULAR | Status: DC | PRN
  Administered 2023-04-22: 10 mg via INTRAVENOUS

## 2023-04-22 MED ORDER — MIDAZOLAM HCL 2 MG/2ML IJ SOLN
INTRAMUSCULAR | Status: DC | PRN
  Administered 2023-04-22: 2 mg via INTRAVENOUS

## 2023-04-22 MED ORDER — FENTANYL CITRATE (PF) 250 MCG/5ML IJ SOLN
INTRAMUSCULAR | Status: DC | PRN
  Administered 2023-04-22 (×3): 50 ug via INTRAVENOUS

## 2023-04-22 MED ORDER — DEXMEDETOMIDINE HCL IN NACL 80 MCG/20ML IV SOLN
INTRAVENOUS | Status: DC | PRN
Start: 2023-04-22 — End: 2023-04-22
  Administered 2023-04-22: 12 ug via INTRAVENOUS

## 2023-04-22 MED ORDER — FENTANYL CITRATE (PF) 250 MCG/5ML IJ SOLN
INTRAMUSCULAR | Status: AC
  Filled 2023-04-22: qty 5

## 2023-04-22 MED ORDER — ORAL CARE MOUTH RINSE: 15.0000 mL | Freq: Once | OROMUCOSAL | Status: AC

## 2023-04-22 MED ORDER — POVIDONE-IODINE 10 % EX SWAB
2.0000 | Freq: Once | CUTANEOUS | Status: AC
  Administered 2023-04-22: 2 via TOPICAL

## 2023-04-22 MED ORDER — CHLOROPROCAINE HCL 1 % IJ SOLN
INTRAMUSCULAR | Status: AC
  Filled 2023-04-22: qty 30

## 2023-04-22 MED ORDER — LACTATED RINGERS IV SOLN: INTRAVENOUS | Status: DC

## 2023-04-22 SURGICAL SUPPLY — 22 items
CATH ROBINSON RED A/P 16FR (CATHETERS) ×1 IMPLANT
DRSG TELFA 3X8 NADH STRL (GAUZE/BANDAGES/DRESSINGS) ×1 IMPLANT
FILTER UTR ASPR ASSEMBLY (MISCELLANEOUS) ×1 IMPLANT
GLOVE BIO SURGEON STRL SZ 6.5 (GLOVE) ×1 IMPLANT
GLOVE BIOGEL PI IND STRL 7.0 (GLOVE) ×2 IMPLANT
GOWN STRL REUS W/ TWL LRG LVL3 (GOWN DISPOSABLE) ×2 IMPLANT
GOWN STRL REUS W/TWL LRG LVL3 (GOWN DISPOSABLE) ×2
HOSE CONNECTING 18IN BERKELEY (TUBING) ×1 IMPLANT
KIT BERKELEY 1ST TRI 3/8 NO TR (MISCELLANEOUS) ×1 IMPLANT
KIT BERKELEY 1ST TRIMESTER 3/8 (MISCELLANEOUS) ×1 IMPLANT
NS IRRIG 1000ML POUR BTL (IV SOLUTION) ×1 IMPLANT
PACK VAGINAL MINOR WOMEN LF (CUSTOM PROCEDURE TRAY) ×1 IMPLANT
PAD OB MATERNITY 4.3X12.25 (PERSONAL CARE ITEMS) ×1 IMPLANT
SET BERKELEY SUCTION TUBING (SUCTIONS) ×1 IMPLANT
SPIKE FLUID TRANSFER (MISCELLANEOUS) ×1 IMPLANT
TOWEL GREEN STERILE FF (TOWEL DISPOSABLE) ×2 IMPLANT
UNDERPAD 30X36 HEAVY ABSORB (UNDERPADS AND DIAPERS) ×1 IMPLANT
VACURETTE 10 RIGID CVD (CANNULA) IMPLANT
VACURETTE 7MM CVD STRL WRAP (CANNULA) IMPLANT
VACURETTE 8 RIGID CVD (CANNULA) IMPLANT
VACURETTE 8MM F TIP (MISCELLANEOUS) IMPLANT
VACURETTE 9 RIGID CVD (CANNULA) IMPLANT

## 2023-04-22 NOTE — Addendum Note (Signed)
Addendum  created 04/22/23 0836 by Lewie Loron, MD   Delete clinical note

## 2023-04-22 NOTE — Anesthesia Procedure Notes (Signed)
Procedure Name: LMA Insertion Date/Time: 04/22/2023 7:42 AM  Performed by: Loleta Tonilynn Bieker, CRNAPre-anesthesia Checklist: Patient identified, Patient being monitored, Timeout performed, Emergency Drugs available and Suction available Patient Re-evaluated:Patient Re-evaluated prior to induction Oxygen Delivery Method: Circle system utilized Preoxygenation: Pre-oxygenation with 100% oxygen Induction Type: IV induction Ventilation: Mask ventilation without difficulty LMA: LMA inserted LMA Size: 4.0 Tube type: Oral Number of attempts: 1 Placement Confirmation: positive ETCO2 and breath sounds checked- equal and bilateral Tube secured with: Tape Dental Injury: Teeth and Oropharynx as per pre-operative assessment

## 2023-04-22 NOTE — Anesthesia Postprocedure Evaluation (Signed)
Anesthesia Post Note  Patient: Alyssa Bryan  Procedure(s) Performed: DILATATION AND EVACUATION (Perineum)     Patient location during evaluation: PACU Anesthesia Type: General Level of consciousness: sedated and patient cooperative Pain management: pain level controlled Vital Signs Assessment: post-procedure vital signs reviewed and stable Respiratory status: spontaneous breathing Cardiovascular status: stable Anesthetic complications: no   No notable events documented.  Last Vitals:  Vitals:   04/22/23 0830 04/22/23 0845  BP: 100/62   Pulse: 69   Resp: (!) 0   Temp:  36.7 C  SpO2: 96%     Last Pain:  Vitals:   04/22/23 0627  PainSc: 0-No pain                 Lewie Loron

## 2023-04-22 NOTE — Transfer of Care (Signed)
Immediate Anesthesia Transfer of Care Note  Patient: Alyssa Bryan  Procedure(s) Performed: DILATATION AND EVACUATION (Perineum)  Patient Location: PACU  Anesthesia Type:General  Level of Consciousness: awake  Airway & Oxygen Therapy: Patient Spontanous Breathing  Post-op Assessment: Report given to RN and Post -op Vital signs reviewed and stable  Post vital signs: Reviewed and stable  Last Vitals:  Vitals Value Taken Time  BP 88/64 04/22/23 0815  Temp    Pulse 80 04/22/23 0814  Resp 17 04/22/23 0814  SpO2 97 % 04/22/23 0814  Vitals shown include unfiled device data.  Last Pain:  Vitals:   04/22/23 0627  PainSc: 0-No pain         Complications: No notable events documented.

## 2023-04-22 NOTE — Discharge Instructions (Signed)
REMOVE SCOPOLAMINE PATCH IN 72 HOURS !  < BEHIND R EAR>

## 2023-04-22 NOTE — Addendum Note (Signed)
Addendum  created 04/22/23 1019 by Lewie Loron, MD   Clinical Note Signed

## 2023-04-22 NOTE — Anesthesia Postprocedure Evaluation (Deleted)
Anesthesia Post Note  Patient: Alyssa Bryan  Procedure(s) Performed: DILATATION AND EVACUATION (Perineum)     Patient location during evaluation: PACU Anesthesia Type: General Level of consciousness: sedated and patient cooperative Pain management: pain level controlled Vital Signs Assessment: post-procedure vital signs reviewed and stable Respiratory status: spontaneous breathing Cardiovascular status: stable Anesthetic complications: no   No notable events documented.  Last Vitals:  Vitals:   04/22/23 0815 04/22/23 0820  BP:  (!) 104/59  Pulse:  74  Resp:  13  Temp: 36.7 C   SpO2:  96%    Last Pain:  Vitals:   04/22/23 0627  PainSc: 0-No pain                 Lewie Loron

## 2023-04-22 NOTE — Progress Notes (Signed)
No updates to above H&P. Patient arrived NPO and was consented in PACU. Risks again discussed, all questions answered, and consent signed. Proceed with above surgery.    Belva Agee MD

## 2023-04-22 NOTE — Op Note (Signed)
PREOPERATIVE DIAGNOSES: 1. Missed Abortion in 1st trimester  POSTOPERATIVE DIAGNOSES: Same  PROCEDURE PERFORMED: Dilation, suction, sharp curretage  SURGEON: Dr. Belva Agee  ANESTHESIA: Paracervical block and IV sedation  ESTIMATED BLOOD LOSS: 50cc.  COMPLICATIONS: None  TUBES: None.  DRAINS: None  PATHOLOGY: Endometrial curettings for chromosomal analysis  FINDINGS: On exam, under anesthesia, normal appearing vulva and vagina, 7  week sized uterus  Operative findings demonstrated plethora of POCs   Procedure: The patient was taken to the operating room where she was properly prepped and draped in sterile manner under general anesthesia. After bimanual examination, the cervix was exposed with a speculum and the anterior lip of the cervix grasped with a tenaculum. Paracervical block performed. The endocervical canal was then progressively dilated to 7mm. Suction catheter was introduced into the uterus and to the uterine fundus. The uterus was evacuated and good tissue return was noted. A sharp curettage was then performed until gritty texture noted. All instruments were removed from vagina. The sponge and lap counts were correct times 2 at this time. The patient's procedure was terminated. We then awakened her. She was sent to the Recovery Room in good condition.    Belva Agee MD

## 2023-04-23 ENCOUNTER — Encounter (HOSPITAL_COMMUNITY): Payer: Self-pay | Admitting: Obstetrics and Gynecology

## 2023-04-23 LAB — SURGICAL PATHOLOGY

## 2023-12-29 ENCOUNTER — Encounter: Payer: Self-pay | Admitting: Physician Assistant

## 2023-12-29 ENCOUNTER — Ambulatory Visit (INDEPENDENT_AMBULATORY_CARE_PROVIDER_SITE_OTHER): Admitting: Physician Assistant

## 2023-12-29 VITALS — BP 136/80 | HR 85 | Temp 98.9°F | Ht 69.0 in | Wt 234.0 lb

## 2023-12-29 DIAGNOSIS — L700 Acne vulgaris: Secondary | ICD-10-CM | POA: Diagnosis not present

## 2023-12-29 DIAGNOSIS — Z136 Encounter for screening for cardiovascular disorders: Secondary | ICD-10-CM | POA: Diagnosis not present

## 2023-12-29 DIAGNOSIS — Z13220 Encounter for screening for lipoid disorders: Secondary | ICD-10-CM

## 2023-12-29 DIAGNOSIS — Z23 Encounter for immunization: Secondary | ICD-10-CM

## 2023-12-29 DIAGNOSIS — Z0000 Encounter for general adult medical examination without abnormal findings: Secondary | ICD-10-CM

## 2023-12-29 DIAGNOSIS — E039 Hypothyroidism, unspecified: Secondary | ICD-10-CM

## 2023-12-29 DIAGNOSIS — Z1159 Encounter for screening for other viral diseases: Secondary | ICD-10-CM

## 2023-12-29 DIAGNOSIS — E669 Obesity, unspecified: Secondary | ICD-10-CM | POA: Diagnosis not present

## 2023-12-29 DIAGNOSIS — Z114 Encounter for screening for human immunodeficiency virus [HIV]: Secondary | ICD-10-CM

## 2023-12-29 LAB — CBC WITH DIFFERENTIAL/PLATELET
Basophils Absolute: 0 10*3/uL (ref 0.0–0.1)
Basophils Relative: 0.5 % (ref 0.0–3.0)
Eosinophils Absolute: 0.1 10*3/uL (ref 0.0–0.7)
Eosinophils Relative: 1.4 % (ref 0.0–5.0)
HCT: 40 % (ref 36.0–46.0)
Hemoglobin: 13.7 g/dL (ref 12.0–15.0)
Lymphocytes Relative: 29.5 % (ref 12.0–46.0)
Lymphs Abs: 2.6 10*3/uL (ref 0.7–4.0)
MCHC: 34.2 g/dL (ref 30.0–36.0)
MCV: 86.6 fl (ref 78.0–100.0)
Monocytes Absolute: 0.5 10*3/uL (ref 0.1–1.0)
Monocytes Relative: 6.2 % (ref 3.0–12.0)
Neutro Abs: 5.5 10*3/uL (ref 1.4–7.7)
Neutrophils Relative %: 62.4 % (ref 43.0–77.0)
Platelets: 354 10*3/uL (ref 150.0–400.0)
RBC: 4.62 Mil/uL (ref 3.87–5.11)
RDW: 13.5 % (ref 11.5–15.5)
WBC: 8.8 10*3/uL (ref 4.0–10.5)

## 2023-12-29 LAB — COMPREHENSIVE METABOLIC PANEL WITH GFR
ALT: 15 U/L (ref 0–35)
AST: 15 U/L (ref 0–37)
Albumin: 4.5 g/dL (ref 3.5–5.2)
Alkaline Phosphatase: 62 U/L (ref 39–117)
BUN: 10 mg/dL (ref 6–23)
CO2: 25 meq/L (ref 19–32)
Calcium: 9.3 mg/dL (ref 8.4–10.5)
Chloride: 103 meq/L (ref 96–112)
Creatinine, Ser: 0.68 mg/dL (ref 0.40–1.20)
GFR: 114.29 mL/min (ref >60.00)
Glucose, Bld: 85 mg/dL (ref 70–99)
Potassium: 3.8 meq/L (ref 3.5–5.1)
Sodium: 138 meq/L (ref 135–145)
Total Bilirubin: 0.4 mg/dL (ref 0.2–1.2)
Total Protein: 7.5 g/dL (ref 6.0–8.3)

## 2023-12-29 LAB — LIPID PANEL
Cholesterol: 209 mg/dL — ABNORMAL HIGH (ref 0–200)
HDL: 51.2 mg/dL (ref >39.00)
LDL Cholesterol: 135 mg/dL — ABNORMAL HIGH (ref 0–99)
NonHDL: 158.02
Total CHOL/HDL Ratio: 4
Triglycerides: 116 mg/dL (ref 0.0–149.0)
VLDL: 23.2 mg/dL (ref 0.0–40.0)

## 2023-12-29 LAB — TSH: TSH: 1.82 u[IU]/mL (ref 0.35–5.50)

## 2023-12-29 LAB — HEMOGLOBIN A1C: Hgb A1c MFr Bld: 5.7 % (ref 4.6–6.5)

## 2023-12-29 MED ORDER — TRETINOIN 0.05 % EX CREA: TOPICAL_CREAM | Freq: Every day | CUTANEOUS | 0 refills | Status: AC

## 2023-12-29 NOTE — Patient Instructions (Signed)
 It was great to see you!  Please go to the lab for blood work.   Our office will call you with your results unless you have chosen to receive results via MyChart.  If your blood work is normal we will follow-up each year for physicals and as scheduled for chronic medical problems.  If anything is abnormal we will treat accordingly and get you in for a follow-up.  Take care,  Lelon Mast

## 2023-12-29 NOTE — Progress Notes (Signed)
 Subjective:    Alyssa Bryan is a 33 y.o. female and is here for a comprehensive physical exam.  HPI  Health Maintenance Due  Topic Date Due   HIV Screening  Never done   Hepatitis C Screening  Never done   She was a prior patient in our office but was lost to follow up due to close follow up with other specialists.  Relevant medical history: She had a missed abortion in Oct 2024. Required D&C. She was about 5 weeks with possible twins.  Acute Concerns: None  Chronic Issues: Hypothyroidism Pt is on Levothyroxine  137 mcg daily. Hypothyroidism is well-managed. No acute concerns reported today.  Acne History of acne vulgaris Has seen dermatology in the past and did well with tretinoin 0.05% She would like refill   Health Maintenance: Immunizations -- See above. Colonoscopy -- Last done 11/25/2019 for rectal bleeding. Findings show normal colon mucosa, biopsies negative for IBD or microscopic colitis, internal hemorrhoids, and 2 small polyps. 5 year repeat recommended, next due in 2026. Mammogram -- N/a PAP -- UpToDate; requesting records Bone Density -- N/a Diet -- Overall healthy diet. Exercise -- Not consistently able to get exercise -- she spends significant amount of her time driving.  Sleep habits -- Good sleeping habits, no concerns reported. Mood -- Stable.  UTD with dentist? - UTD UTD with eye doctor? - UTD  Weight history: Wt Readings from Last 10 Encounters:  12/29/23 234 lb (106.1 kg)  04/22/23 225 lb (102.1 kg)  04/17/20 232 lb (105.2 kg)  04/05/20 232 lb 3.2 oz (105.3 kg)  11/25/19 226 lb (102.5 kg)  11/12/19 226 lb 6.4 oz (102.7 kg)  10/06/19 224 lb (101.6 kg)  01/06/19 206 lb (93.4 kg)  09/09/18 202 lb 8 oz (91.9 kg)  07/31/18 202 lb (91.6 kg)   Body mass index is 34.56 kg/m. Patient's last menstrual period was 12/02/2023 (exact date).  Alcohol use:  reports current alcohol use.  Tobacco use:  Tobacco Use: Low Risk  (12/29/2023)    Patient History    Smoking Tobacco Use: Never    Smokeless Tobacco Use: Never    Passive Exposure: Not on file   Eligible for lung cancer screening? no     12/29/2023    1:30 PM  Depression screen PHQ 2/9  Decreased Interest 0  Down, Depressed, Hopeless 0  PHQ - 2 Score 0     Other providers/specialists: Patient Care Team: Job Lukes, [place] as PCP - General (Physician Assistant) Dannielle Bouchard, DO as Consulting Physician (Obstetrics and Gynecology) Job Lukes, [place] (Physician Assistant)    PMHx, SurgHx, SocialHx, Medications, and Allergies were reviewed in the Visit Navigator and updated as appropriate.   Past Medical History:  Diagnosis Date   GERD (gastroesophageal reflux disease)    no meds, diet controlled   Hypothyroidism      Past Surgical History:  Procedure Laterality Date   COLONOSCOPY  11/25/2019   indication: rectal bleeding; recommendation: recall 5 year   DILATION AND EVACUATION N/A 04/22/2023   Procedure: DILATATION AND EVACUATION;  Surgeon: Marne Kelly Nest, MD;  Location: [facility] OR;  Service: Gynecology;  Laterality: N/A;   HERNIA REPAIR     UPPER GI ENDOSCOPY  04/17/2020   indication: RUQ pain; findings: esophagitis, small hiatal hernia, gastritis, Hill Grade IV gastroesophageal flap     Family History  Problem Relation Age of Onset   Mental retardation Maternal Grandmother    Pancreatic cancer Maternal Grandfather    Lung cancer  Paternal Grandfather    Colon cancer Neg Hx    Breast cancer Neg Hx    Esophageal cancer Neg Hx    Liver disease Neg Hx    Ulcerative colitis Neg Hx    Stomach cancer Neg Hx     Social History   Tobacco Use   Smoking status: Never   Smokeless tobacco: Never  Vaping Use   Vaping status: Never Used  Substance Use Topics   Alcohol use: Yes    Comment: occasional wine   Drug use: No    Review of Systems:   Review of Systems  Constitutional:  Negative for chills, fever, malaise/fatigue and weight  loss.  HENT:  Negative for hearing loss, sinus pain and sore throat.   Respiratory:  Negative for cough and hemoptysis.   Cardiovascular:  Negative for chest pain, palpitations, leg swelling and PND.  Gastrointestinal:  Negative for abdominal pain, constipation, diarrhea, heartburn, nausea and vomiting.  Genitourinary:  Negative for dysuria, frequency and urgency.  Musculoskeletal:  Negative for back pain, myalgias and neck pain.  Skin:  Negative for itching and rash.  Neurological:  Negative for dizziness, tingling, seizures and headaches.  Endo/Heme/Allergies:  Negative for polydipsia.  Psychiatric/Behavioral:  Negative for depression. The patient is not nervous/anxious.      Objective:   BP 136/80 (BP Location: Left Arm, Patient Position: Sitting, Cuff Size: Large)   Pulse 85   Temp 98.9 F (37.2 C) (Temporal)   Ht 5' 9 (1.753 m)   Wt 234 lb (106.1 kg)   LMP 12/02/2023 (Exact Date)   SpO2 99%   Breastfeeding No   BMI 34.56 kg/m  Body mass index is 34.56 kg/m.   General Appearance:    Alert, cooperative, no distress, appears stated age  Head:    Normocephalic, without obvious abnormality, atraumatic  Eyes:    PERRL, conjunctiva/corneas clear, EOM's intact, fundi    benign, both eyes  Ears:    Normal TM's and external ear canals, both ears  Nose:   Nares normal, septum midline, mucosa normal, no drainage    or sinus tenderness  Throat:   Lips, mucosa, and tongue normal; teeth and gums normal  Neck:   Supple, symmetrical, trachea midline, no adenopathy;    thyroid :  no enlargement/tenderness/nodules; no carotid   bruit or JVD  Back:     Symmetric, no curvature, ROM normal, no CVA tenderness  Lungs:     Clear to auscultation bilaterally, respirations unlabored  Chest Wall:    No tenderness or deformity   Heart:    Regular rate and rhythm, S1 and S2 normal, no murmur, rub or gallop  Breast Exam:    Deferred   Abdomen:     Soft, non-tender, bowel sounds active all four  quadrants,    no masses, no organomegaly  Genitalia:    Deferred  Extremities:   Extremities normal, atraumatic, no cyanosis or edema  Pulses:   2+ and symmetric all extremities  Skin:   Skin color, texture, turgor normal, no rashes or lesions  Lymph nodes:   Cervical, supraclavicular, and axillary nodes normal  Neurologic:   CNII-XII intact, normal strength, sensation and reflexes    throughout    Assessment/Plan:   Routine physical examination Today patient counseled on age appropriate routine health concerns for screening and prevention, each reviewed and up to date or declined. Immunizations reviewed and up to date or declined. Labs ordered and reviewed. Risk factors for depression reviewed and negative. Hearing function  and visual acuity are intact. ADLs screened and addressed as needed. Functional ability and level of safety reviewed and appropriate. Education, counseling and referrals performed based on assessed risks today. Patient provided with a copy of personalized plan for preventive services.  Hypothyroidism, unspecified type Update TSH If TSH abnormal, will have her closely follow up with her endocrinologist  Obesity, unspecified class, unspecified obesity type, unspecified whether serious comorbidity present Continue efforts at healthy lifestyle  Acne vulgaris Well controlled with tretinoin -- will refill for patient  Screening for HIV (human immunodeficiency virus) Update HIV  Encounter for screening for other viral diseases Update hepatitis C  Encounter for lipid screening for cardiovascular disease Update lipid panel and provide recommendations   Need for prophylactic vaccination with combined diphtheria-tetanus-pertussis (DTP) vaccine Updated today  Michele Judy, PA-C [facility]

## 2023-12-30 LAB — HIV ANTIBODY (ROUTINE TESTING W REFLEX): HIV 1&2 Ab, 4th Generation: NONREACTIVE

## 2023-12-30 LAB — HEPATITIS C ANTIBODY: Hepatitis C Ab: NONREACTIVE

## 2023-12-31 ENCOUNTER — Ambulatory Visit: Payer: Self-pay | Admitting: Physician Assistant
